# Patient Record
Sex: Female | Born: 1995
Health system: Southern US, Community
[De-identification: ages and names within clinical notes are randomized; demographics above are authoritative.]

## PROBLEM LIST (undated history)

## (undated) ENCOUNTER — Inpatient Hospital Stay (HOSPITAL_COMMUNITY): Payer: Self-pay

## (undated) DIAGNOSIS — F909 Attention-deficit hyperactivity disorder, unspecified type: Secondary | ICD-10-CM

## (undated) DIAGNOSIS — R1013 Epigastric pain: Secondary | ICD-10-CM

## (undated) DIAGNOSIS — J329 Chronic sinusitis, unspecified: Secondary | ICD-10-CM

## (undated) DIAGNOSIS — IMO0001 Reserved for inherently not codable concepts without codable children: Secondary | ICD-10-CM

## (undated) DIAGNOSIS — J4520 Mild intermittent asthma, uncomplicated: Secondary | ICD-10-CM

## (undated) DIAGNOSIS — H6692 Otitis media, unspecified, left ear: Secondary | ICD-10-CM

## (undated) DIAGNOSIS — L309 Dermatitis, unspecified: Secondary | ICD-10-CM

## (undated) DIAGNOSIS — F319 Bipolar disorder, unspecified: Secondary | ICD-10-CM

## (undated) DIAGNOSIS — R319 Hematuria, unspecified: Secondary | ICD-10-CM

## (undated) DIAGNOSIS — B001 Herpesviral vesicular dermatitis: Secondary | ICD-10-CM

## (undated) DIAGNOSIS — Z0000 Encounter for general adult medical examination without abnormal findings: Secondary | ICD-10-CM

## (undated) DIAGNOSIS — F329 Major depressive disorder, single episode, unspecified: Secondary | ICD-10-CM

## (undated) DIAGNOSIS — J029 Acute pharyngitis, unspecified: Principal | ICD-10-CM

## (undated) DIAGNOSIS — F419 Anxiety disorder, unspecified: Secondary | ICD-10-CM

## (undated) DIAGNOSIS — K219 Gastro-esophageal reflux disease without esophagitis: Secondary | ICD-10-CM

## (undated) HISTORY — DX: Hematuria, unspecified: R31.9

## (undated) HISTORY — DX: Major depressive disorder, single episode, unspecified: F32.9

## (undated) HISTORY — DX: Acute pharyngitis, unspecified: J02.9

## (undated) HISTORY — DX: Epigastric pain: R10.13

## (undated) HISTORY — DX: Dermatitis, unspecified: L30.9

## (undated) HISTORY — DX: Attention-deficit hyperactivity disorder, unspecified type: F90.9

## (undated) HISTORY — DX: Anxiety disorder, unspecified: F41.9

## (undated) HISTORY — DX: Encounter for general adult medical examination without abnormal findings: Z00.00

## (undated) HISTORY — DX: Reserved for inherently not codable concepts without codable children: IMO0001

## (undated) HISTORY — DX: Mild intermittent asthma, uncomplicated: J45.20

## (undated) HISTORY — DX: Otitis media, unspecified, left ear: H66.92

## (undated) HISTORY — DX: Bipolar disorder, unspecified: F31.9

## (undated) HISTORY — DX: Chronic sinusitis, unspecified: J32.9

## (undated) HISTORY — DX: Herpesviral vesicular dermatitis: B00.1

## (undated) HISTORY — DX: Gastro-esophageal reflux disease without esophagitis: K21.9

---
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
## 2014-01-02 LAB — WOUND CULTURE
Gram Stain: NONE SEEN
Gram Stain: NONE SEEN

## 2014-02-07 ENCOUNTER — Ambulatory Visit (INDEPENDENT_AMBULATORY_CARE_PROVIDER_SITE_OTHER): Payer: 59 | Admitting: Family Medicine

## 2014-02-07 ENCOUNTER — Encounter: Payer: Self-pay | Admitting: Family Medicine

## 2014-02-07 VITALS — BP 120/78 | HR 110 | Temp 99.6°F | Ht 63.0 in | Wt 122.0 lb

## 2014-02-07 DIAGNOSIS — E860 Dehydration: Secondary | ICD-10-CM

## 2014-02-07 DIAGNOSIS — R509 Fever, unspecified: Secondary | ICD-10-CM

## 2014-02-07 DIAGNOSIS — R0989 Other specified symptoms and signs involving the circulatory and respiratory systems: Secondary | ICD-10-CM

## 2014-02-07 DIAGNOSIS — J209 Acute bronchitis, unspecified: Secondary | ICD-10-CM

## 2014-02-07 DIAGNOSIS — J45901 Unspecified asthma with (acute) exacerbation: Secondary | ICD-10-CM

## 2014-02-07 DIAGNOSIS — J4541 Moderate persistent asthma with (acute) exacerbation: Secondary | ICD-10-CM

## 2014-02-07 LAB — POCT INFLUENZA A/B
INFLUENZA A, POC: NEGATIVE
Influenza B, POC: NEGATIVE

## 2014-02-07 MED ORDER — HYDROCODONE-HOMATROPINE 5-1.5 MG/5ML PO SYRP: 5.0000 mL | ORAL_SOLUTION | Freq: Three times a day (TID) | ORAL | Status: DC | PRN

## 2014-02-07 MED ORDER — CEFDINIR 300 MG PO CAPS: 300.0000 mg | ORAL_CAPSULE | Freq: Two times a day (BID) | ORAL | Status: AC

## 2014-02-07 NOTE — Patient Instructions (Signed)
Encouraged increased rest and hydration, add probiotics, zinc such as Coldeze or Xicam. Treat fevers as needed, Elderberry and Vitamin C 500 mg daily, [email or web address], [employer]   Acute Bronchitis Bronchitis is inflammation of the airways that extend from the windpipe into the lungs (bronchi). The inflammation often causes mucus to develop. This leads to a cough, which is the most common symptom of bronchitis.  In acute bronchitis, the condition usually develops suddenly and goes away over time, usually in a couple weeks. Smoking, allergies, and asthma can make bronchitis worse. Repeated episodes of bronchitis may cause further lung problems.  CAUSES Acute bronchitis is most often caused by the same virus that causes a cold. The virus can spread from person to person (contagious) through coughing, sneezing, and touching contaminated objects. SIGNS AND SYMPTOMS   Cough.   Fever.   Coughing up mucus.   Body aches.   Chest congestion.   Chills.   Shortness of breath.   Sore throat.  DIAGNOSIS  Acute bronchitis is usually diagnosed through a physical exam. Your health care provider will also ask you questions about your medical history. Tests, such as chest X-rays, are sometimes done to rule out other conditions.  TREATMENT  Acute bronchitis usually goes away in a couple weeks. Oftentimes, no medical treatment is necessary. Medicines are sometimes given for relief of fever or cough. Antibiotic medicines are usually not needed but may be prescribed in certain situations. In some cases, an inhaler may be recommended to help reduce shortness of breath and control the cough. A cool mist vaporizer may also be used to help thin bronchial secretions and make it easier to clear the chest.  HOME CARE INSTRUCTIONS  Get plenty of rest.   Drink enough fluids to keep your urine clear or pale yellow (unless you have a medical condition that requires fluid restriction). Increasing fluids  may help thin your respiratory secretions (sputum) and reduce chest congestion, and it will prevent dehydration.   Take medicines only as directed by your health care provider.  If you were prescribed an antibiotic medicine, finish it all even if you start to feel better.  Avoid smoking and secondhand smoke. Exposure to cigarette smoke or irritating chemicals will make bronchitis worse. If you are a smoker, consider using nicotine gum or skin patches to help control withdrawal symptoms. Quitting smoking will help your lungs heal faster.   Reduce the chances of another bout of acute bronchitis by washing your hands frequently, avoiding people with cold symptoms, and trying not to touch your hands to your mouth, nose, or eyes.   Keep all follow-up visits as directed by your health care provider.  SEEK MEDICAL CARE IF: Your symptoms do not improve after 1 week of treatment.  SEEK IMMEDIATE MEDICAL CARE IF:  You develop an increased fever or chills.   You have chest pain.   You have severe shortness of breath.  You have bloody sputum.   You develop dehydration.  You faint or repeatedly feel like you are going to pass out.  You develop repeated vomiting.  You develop a severe headache. MAKE SURE YOU:   Understand these instructions.  Will watch your condition.  Will get help right away if you are not doing well or get worse. Document Released: 06/05/2004 Document Revised: 09/12/2013 Document Reviewed: 10/19/2012 [facility] Patient Information 2015 [place], [place]. This information is not intended to replace advice given to you by your health care provider. Make sure you discuss any questions you have  with your health care provider.  

## 2014-02-08 DIAGNOSIS — E860 Dehydration: Secondary | ICD-10-CM | POA: Insufficient documentation

## 2014-02-08 NOTE — Assessment & Plan Note (Signed)
Started on increased respiratory treatments and may start antibiotics if worsens

## 2014-02-08 NOTE — Progress Notes (Signed)
Patient ID: Marie Smith, female   DOB: Jan 27, 1996, 18 y.o.   MRN: [id number] Marie Smith [id number] Feb 02, 1996 02/08/2014      Progress Note-Follow Up  Subjective  Chief Complaint  Chief Complaint  Patient presents with  . Asthma  . Cough    w/phlegm - chest congestion X 5 days    HPI  Patient is a 18 year old female in today for urgent care. She's been feeling poorly for about 4 days now. She has significant congestion productive of green phlegm both her head and now in her chest. She was chills malaise, myalgias and anorexia. She denies any significant shortness or breath or wheezing. No ear pain or sore throat. No one in her family has similar symptoms  Past Medical History  Diagnosis Date  . Abdominal pain, epigastric   . ADHD (attention deficit hyperactivity disorder)     ADHD  . Pharyngitis 08/18/2011  . Asthma   . Preventative health care 08/19/2011  . Herpes labialis 08/18/2011  . Anxiety and depression 01/27/2012  . Eczema 02/19/2012  . Otitis media of left ear 02/19/2012  . Pharyngitis 05/26/2012  . Hematuria 05/26/2012  . Reflux   . Sinusitis 05/26/2012    No past surgical history on file.  Family History  Problem Relation Age of Onset  . Cancer Paternal Grandmother   . Emphysema Paternal Grandmother     smoker  . COPD Paternal Grandmother   . Stroke Paternal Grandmother   . Heart attack Paternal Grandmother   . Mental illness Paternal Grandmother     Bipolar    History   Social History  . Marital Status: Single    Spouse Name: N/A    Number of Children: N/A  . Years of Education: N/A   Occupational History  . Not on file.   Social History Main Topics  . Smoking status: Never Smoker   . Smokeless tobacco: Never Used  . Alcohol Use: No  . Drug Use: No  . Sexual Activity: No   Other Topics Concern  . Not on file   Social History Narrative   11th grade    Current Outpatient Prescriptions on File Prior to Visit  Medication Sig  Dispense Refill  . albuterol (PROVENTIL HFA;VENTOLIN HFA) 108 (90 BASE) MCG/ACT inhaler Inhale 2 puffs into the lungs every 6 (six) hours as needed for wheezing or shortness of breath.  1 Inhaler  5  . albuterol (PROVENTIL) (2.5 MG/3ML) 0.083% nebulizer solution Take 3 mLs (2.5 mg total) by nebulization every 6 (six) hours as needed for wheezing or shortness of breath.  150 mL  1  . ALPRAZolam (XANAX) 0.25 MG tablet Take 1 tablet (0.25 mg total) by mouth daily as needed for sleep or anxiety.  20 tablet  0  . amoxicillin-clavulanate (AUGMENTIN) 875-125 MG per tablet Take 1 tablet by mouth 2 (two) times daily.  14 tablet  0  . amphetamine-dextroamphetamine (ADDERALL XR) 10 MG 24 hr capsule Take 1 capsule (10 mg total) by mouth daily. RX for 02-19-14  30 capsule  0  . amphetamine-dextroamphetamine (ADDERALL XR) 10 MG 24 hr capsule Take 1 capsule (10 mg total) by mouth daily. RX for 01-20-14  30 capsule  0  . amphetamine-dextroamphetamine (ADDERALL XR) 10 MG 24 hr capsule Take 1 capsule (10 mg total) by mouth every morning. RX 8-11- 2015  30 capsule  0  . beclomethasone (QVAR) 80 MCG/ACT inhaler Inhale 1 puff into the lungs 2 (two) times daily.  1 Inhaler  3  . cetirizine (ZYRTEC) 10 MG tablet Take 10 mg by mouth daily.      . clotrimazole-betamethasone (LOTRISONE) cream Apply 1 application topically 2 (two) times daily.  30 g  0  . promethazine-dextromethorphan (PROMETHAZINE-DM) 6.25-15 MG/5ML syrup Take 5 mLs by mouth 4 (four) times daily as needed.  240 mL  0  . sertraline (ZOLOFT) 50 MG tablet Take 1 tablet (50 mg total) by mouth daily.  30 tablet  3   No current facility-administered medications on file prior to visit.    No Known Allergies  Review of Systems  Review of Systems  Constitutional: Positive for fever, chills and malaise/fatigue.  HENT: Negative for congestion.   Eyes: Negative for pain and discharge.  Respiratory: Negative for shortness of breath.   Cardiovascular: Negative for  chest pain, palpitations and leg swelling.  Gastrointestinal: Negative for nausea, abdominal pain and diarrhea.  Genitourinary: Negative for dysuria.  Musculoskeletal: Negative for falls.  Skin: Negative for rash.  Neurological: Positive for weakness. Negative for loss of consciousness and headaches.  Endo/Heme/Allergies: Negative for polydipsia.  Psychiatric/Behavioral: Negative for depression and suicidal ideas. The patient is not nervous/anxious and does not have insomnia.     Objective  BP 120/78  Pulse 110  Temp(Src) 99.6 F (37.6 C) (Oral)  Ht 5\' 3"  (1.6 m)  Wt 122 lb (55.339 kg)  BMI 21.62 kg/m2  SpO2 100%  Physical Exam Physical Exam  Constitutional: She is oriented to person, place, and time and well-developed, well-nourished, and in no distress. No distress.  HENT:  Head: Normocephalic and atraumatic.  Eyes: Conjunctivae are normal.  Neck: Neck supple. No thyromegaly present.  Cardiovascular: Normal rate, regular rhythm and normal heart sounds.   No murmur heard. Pulmonary/Chest: Effort normal and breath sounds normal. She has no wheezes.  Abdominal: She exhibits no distension and no mass.  Musculoskeletal: She exhibits no edema.  Lymphadenopathy:    She has no cervical adenopathy.  Neurological: She is alert and oriented to person, place, and time.  Skin: Skin is warm and dry. No rash noted. She is not diaphoretic.  Psychiatric: Memory, affect and judgment normal.    No results found for this basename: TSH   Lab Results  Component Value Date   WBC 10.1 08/18/2011   HGB 15.7 08/18/2011   HCT 46.3 08/18/2011   MCV 84.8 08/18/2011   PLT 350 08/18/2011   No results found for this basename: CREATININE, BUN, NA, K, CL, CO2   Lab Results  Component Value Date   ALT 11 05/15/2011   AST 16 05/15/2011   ALKPHOS 80 05/15/2011   BILITOT 0.6 05/15/2011     Assessment & Plan  Asthma with acute exacerbation Started on increased respiratory treatments and may start  antibiotics if worsens

## 2014-02-14 ENCOUNTER — Ambulatory Visit: Payer: 59 | Admitting: Family Medicine

## 2014-03-29 ENCOUNTER — Ambulatory Visit (INDEPENDENT_AMBULATORY_CARE_PROVIDER_SITE_OTHER): Payer: 59

## 2014-03-29 DIAGNOSIS — Z308 Encounter for other contraceptive management: Secondary | ICD-10-CM

## 2014-03-29 DIAGNOSIS — Z23 Encounter for immunization: Secondary | ICD-10-CM

## 2014-03-29 MED ORDER — MEDROXYPROGESTERONE ACETATE 150 MG/ML IM SUSP
150.0000 mg | Freq: Once | INTRAMUSCULAR | Status: AC
  Administered 2014-03-29: 150 mg via INTRAMUSCULAR

## 2014-04-21 ENCOUNTER — Ambulatory Visit: Payer: 59 | Admitting: Family Medicine

## 2014-04-21 ENCOUNTER — Encounter: Payer: Self-pay | Admitting: Physician Assistant

## 2014-04-21 ENCOUNTER — Ambulatory Visit (INDEPENDENT_AMBULATORY_CARE_PROVIDER_SITE_OTHER): Payer: 59 | Admitting: Physician Assistant

## 2014-04-21 VITALS — BP 139/83 | HR 87 | Temp 98.0°F | Wt 115.0 lb

## 2014-04-21 DIAGNOSIS — F988 Other specified behavioral and emotional disorders with onset usually occurring in childhood and adolescence: Secondary | ICD-10-CM

## 2014-04-21 DIAGNOSIS — F419 Anxiety disorder, unspecified: Secondary | ICD-10-CM

## 2014-04-21 DIAGNOSIS — F32A Depression, unspecified: Secondary | ICD-10-CM

## 2014-04-21 DIAGNOSIS — F329 Major depressive disorder, single episode, unspecified: Secondary | ICD-10-CM

## 2014-04-21 DIAGNOSIS — F909 Attention-deficit hyperactivity disorder, unspecified type: Secondary | ICD-10-CM

## 2014-04-21 DIAGNOSIS — L239 Allergic contact dermatitis, unspecified cause: Secondary | ICD-10-CM | POA: Insufficient documentation

## 2014-04-21 DIAGNOSIS — F418 Other specified anxiety disorders: Secondary | ICD-10-CM

## 2014-04-21 MED ORDER — METHYLPREDNISOLONE (PAK) 4 MG PO TABS: ORAL_TABLET | ORAL | Status: DC

## 2014-04-21 MED ORDER — METHYLPREDNISOLONE ACETATE 40 MG/ML IJ SUSP
40.0000 mg | Freq: Once | INTRAMUSCULAR | Status: AC
  Administered 2014-04-21: 40 mg via INTRAMUSCULAR

## 2014-04-21 MED ORDER — AMPHETAMINE-DEXTROAMPHET ER 10 MG PO CP24: 10.0000 mg | ORAL_CAPSULE | Freq: Every day | ORAL | Status: DC

## 2014-04-21 MED ORDER — ALPRAZOLAM 0.25 MG PO TABS: 0.2500 mg | ORAL_TABLET | Freq: Every day | ORAL | Status: DC | PRN

## 2014-04-21 NOTE — Addendum Note (Signed)
Addended by: Amado CoeGLOVER, Kyler Lerette A on: 04/21/2014 02:37 PM   Modules accepted: Orders

## 2014-04-21 NOTE — Assessment & Plan Note (Signed)
40 Im depomedrol given by nursing.  Start Medrol pack tomorrow.  Claritin or Zyrtec, Sarna lotion and cool compresses for itch.  Continue asthma medications.  Cleaning instructions given to patient to help reduce risk for recurrence.  Patient will have to avoid cat to prevent recurrence.

## 2014-04-21 NOTE — Progress Notes (Signed)
Patient presents to clinic today c/o pruritic rash of face and neck associated with some mild facial swelling. Symptoms have been present since yesterday evening.  Patient also with similar episode a week ago.  Is currently living with a roommate who got a new cat.  Patient is allergic to cat's and found the cat sleeping in her bed.  Did not wash linens.  Patient also with history of asthma.  Denies increased work of breathing with rash and swelling.  Denies difficulty swallowing.  Past Medical History  Diagnosis Date  . Abdominal pain, epigastric   . ADHD (attention deficit hyperactivity disorder)     ADHD  . Pharyngitis 08/18/2011  . Asthma   . Preventative health care 08/19/2011  . Herpes labialis 08/18/2011  . Anxiety and depression 01/27/2012  . Eczema 02/19/2012  . Otitis media of left ear 02/19/2012  . Pharyngitis 05/26/2012  . Hematuria 05/26/2012  . Reflux   . Sinusitis 05/26/2012    Current Outpatient Prescriptions on File Prior to Visit  Medication Sig Dispense Refill  . albuterol (PROVENTIL HFA;VENTOLIN HFA) 108 (90 BASE) MCG/ACT inhaler Inhale 2 puffs into the lungs every 6 (six) hours as needed for wheezing or shortness of breath. 1 Inhaler 5  . albuterol (PROVENTIL) (2.5 MG/3ML) 0.083% nebulizer solution Take 3 mLs (2.5 mg total) by nebulization every 6 (six) hours as needed for wheezing or shortness of breath. 150 mL 1  . amoxicillin-clavulanate (AUGMENTIN) 875-125 MG per tablet Take 1 tablet by mouth 2 (two) times daily. 14 tablet 0  . beclomethasone (QVAR) 80 MCG/ACT inhaler Inhale 1 puff into the lungs 2 (two) times daily. 1 Inhaler 3  . cetirizine (ZYRTEC) 10 MG tablet Take 10 mg by mouth daily.    . clotrimazole-betamethasone (LOTRISONE) cream Apply 1 application topically 2 (two) times daily. 30 g 0  . HYDROcodone-homatropine (HYCODAN) 5-1.5 MG/5ML syrup Take 5 mLs by mouth every 8 (eight) hours as needed for cough. 120 mL 0  . promethazine-dextromethorphan  (PROMETHAZINE-DM) 6.25-15 MG/5ML syrup Take 5 mLs by mouth 4 (four) times daily as needed. 240 mL 0  . sertraline (ZOLOFT) 50 MG tablet Take 1 tablet (50 mg total) by mouth daily. 30 tablet 3   No current facility-administered medications on file prior to visit.    No Known Allergies  Family History  Problem Relation Age of Onset  . Cancer Paternal Grandmother   . Emphysema Paternal Grandmother     smoker  . COPD Paternal Grandmother   . Stroke Paternal Grandmother   . Heart attack Paternal Grandmother   . Mental illness Paternal Grandmother     Bipolar    History   Social History  . Marital Status: Single    Spouse Name: N/A    Number of Children: N/A  . Years of Education: N/A   Social History Main Topics  . Smoking status: Never Smoker   . Smokeless tobacco: Never Used  . Alcohol Use: No  . Drug Use: No  . Sexual Activity: No   Other Topics Concern  . None   Social History Narrative   11th grade   Review of Systems - See HPI.  All other ROS are negative.  BP 139/83 mmHg  Pulse 87  Temp(Src) 98 F (36.7 C)  Wt 115 lb (52.164 kg)  SpO2 99%  Physical Exam  Constitutional: She is oriented to person, place, and time and well-developed, well-nourished, and in no distress.  HENT:  Head: Normocephalic and atraumatic.  Eyes:  Conjunctivae are normal.  Neck: Neck supple.  Cardiovascular: Normal rate, regular rhythm, normal heart sounds and intact distal pulses.   Pulmonary/Chest: Effort normal and breath sounds normal.  Lymphadenopathy:    She has no cervical adenopathy.  Neurological: She is alert and oriented to person, place, and time.  Skin: Skin is dry.     Psychiatric: Affect normal.  Vitals reviewed.   Recent Results (from the past 2160 hour(s))  POCT Influenza A/B     Status: Normal   Collection Time: 02/07/14 10:50 AM  Result Value Ref Range   Influenza A, POC Negative    Influenza B, POC Negative     Assessment/Plan: Allergic contact  dermatitis 40 Im depomedrol given by nursing.  Start Medrol pack tomorrow.  Claritin or Zyrtec, Sarna lotion and cool compresses for itch.  Continue asthma medications.  Cleaning instructions given to patient to help reduce risk for recurrence.  Patient will have to avoid cat to prevent recurrence.

## 2014-04-21 NOTE — Patient Instructions (Signed)
The steroid shot you were given today should help your symptoms. Please start the Medrol dose pack tomorrow.  Take a claritin or zyrtec to help with itch.  Cool compresses and Sarna lotion will also be beneficial.  Continue your asthma medications as directed.  Make sure you wash all of your linens and clean your room very good.  I think your Roommate's cat is the cause of your symptoms.  Repeated contact with this cat dander may cause your asthma symptoms to worsen.

## 2014-05-11 ENCOUNTER — Ambulatory Visit: Payer: 59 | Admitting: Family Medicine

## 2014-05-19 ENCOUNTER — Ambulatory Visit (INDEPENDENT_AMBULATORY_CARE_PROVIDER_SITE_OTHER): Payer: 59 | Admitting: Family Medicine

## 2014-05-19 ENCOUNTER — Encounter: Payer: Self-pay | Admitting: Family Medicine

## 2014-05-19 ENCOUNTER — Ambulatory Visit: Payer: 59 | Admitting: Family Medicine

## 2014-05-19 VITALS — BP 125/81 | HR 81 | Temp 98.2°F | Ht 64.0 in | Wt 117.8 lb

## 2014-05-19 DIAGNOSIS — Z0000 Encounter for general adult medical examination without abnormal findings: Secondary | ICD-10-CM

## 2014-05-19 DIAGNOSIS — F319 Bipolar disorder, unspecified: Secondary | ICD-10-CM

## 2014-05-19 DIAGNOSIS — K219 Gastro-esophageal reflux disease without esophagitis: Secondary | ICD-10-CM

## 2014-05-19 DIAGNOSIS — IMO0001 Reserved for inherently not codable concepts without codable children: Secondary | ICD-10-CM

## 2014-05-19 DIAGNOSIS — J4520 Mild intermittent asthma, uncomplicated: Secondary | ICD-10-CM

## 2014-05-19 DIAGNOSIS — R1013 Epigastric pain: Secondary | ICD-10-CM

## 2014-05-19 DIAGNOSIS — Z79899 Other long term (current) drug therapy: Secondary | ICD-10-CM

## 2014-05-19 LAB — COMPREHENSIVE METABOLIC PANEL
ALBUMIN: 4.8 g/dL (ref 3.5–5.2)
ALT: 11 U/L (ref 0–35)
AST: 15 U/L (ref 0–37)
Alkaline Phosphatase: 60 U/L (ref 39–117)
BILIRUBIN TOTAL: 0.5 mg/dL (ref 0.2–1.1)
BUN: 8 mg/dL (ref 6–23)
CO2: 20 mEq/L (ref 19–32)
Calcium: 9.9 mg/dL (ref 8.4–10.5)
Chloride: 108 mEq/L (ref 96–112)
Creat: 0.85 mg/dL (ref 0.50–1.10)
GLUCOSE: 84 mg/dL (ref 70–99)
Potassium: 4.2 mEq/L (ref 3.5–5.3)
SODIUM: 139 meq/L (ref 135–145)
TOTAL PROTEIN: 7 g/dL (ref 6.0–8.3)

## 2014-05-19 LAB — LIPID PANEL
CHOL/HDL RATIO: 2.7 ratio
Cholesterol: 156 mg/dL (ref 0–200)
HDL: 57 mg/dL (ref >39)
LDL Cholesterol: 87 mg/dL (ref 0–99)
Triglycerides: 61 mg/dL (ref <150)
VLDL: 12 mg/dL (ref 0–40)

## 2014-05-19 MED ORDER — RANITIDINE HCL 150 MG PO TABS: 150.0000 mg | ORAL_TABLET | Freq: Every day | ORAL | Status: DC

## 2014-05-19 NOTE — Progress Notes (Signed)
Pre visit review using our clinic review tool, if applicable. No additional management support is needed unless otherwise documented below in the visit note. 

## 2014-05-19 NOTE — Patient Instructions (Signed)
Probiotic daily, such as Digestive Advantage or Phillip's Colon Health  Food Choices for Gastroesophageal Reflux Disease When you have gastroesophageal reflux disease (GERD), the foods you eat and your eating habits are very important. Choosing the right foods can help ease the discomfort of GERD. WHAT GENERAL GUIDELINES DO I NEED TO FOLLOW?  Choose fruits, vegetables, whole grains, low-fat dairy products, and low-fat meat, fish, and poultry.  Limit fats such as oils, salad dressings, butter, nuts, and avocado.  Keep a food diary to identify foods that cause symptoms.  Avoid foods that cause reflux. These may be different for different people.  Eat frequent small meals instead of three large meals each day.  Eat your meals slowly, in a relaxed setting.  Limit fried foods.  Cook foods using methods other than frying.  Avoid drinking alcohol.  Avoid drinking large amounts of liquids with your meals.  Avoid bending over or lying down until 2-3 hours after eating. WHAT FOODS ARE NOT RECOMMENDED? The following are some foods and drinks that may worsen your symptoms: Vegetables Tomatoes. Tomato juice. Tomato and spaghetti sauce. Chili peppers. Onion and garlic. Horseradish. Fruits Oranges, grapefruit, and lemon (fruit and juice). Meats High-fat meats, fish, and poultry. This includes hot dogs, ribs, ham, sausage, salami, and bacon. Dairy Whole milk and chocolate milk. Sour cream. Cream. Butter. Ice cream. Cream cheese.  Beverages Coffee and tea, with or without caffeine. Carbonated beverages or energy drinks. Condiments Hot sauce. Barbecue sauce.  Sweets/Desserts Chocolate and cocoa. Donuts. Peppermint and spearmint. Fats and Oils High-fat foods, including [place] fries and potato chips. Other Vinegar. Strong spices, such as black pepper, white pepper, red pepper, cayenne, curry powder, cloves, ginger, and chili powder. The items listed above may not be a complete list of  foods and beverages to avoid. Contact your dietitian for more information. Document Released: 04/28/2005 Document Revised: 05/03/2013 Document Reviewed: 03/02/2013 [facility] Patient Information 2015 [place], [place]. This information is not intended to replace advice given to you by your health care provider. Make sure you discuss any questions you have with your health care provider.

## 2014-05-20 LAB — TSH: TSH: 0.767 u[IU]/mL (ref 0.350–4.500)

## 2014-05-20 LAB — CBC
HCT: 46.7 % — ABNORMAL HIGH (ref 36.0–46.0)
Hemoglobin: 15.8 g/dL — ABNORMAL HIGH (ref 12.0–15.0)
MCH: 29.5 pg (ref 26.0–34.0)
MCHC: 33.8 g/dL (ref 30.0–36.0)
MCV: 87.1 fL (ref 78.0–100.0)
MPV: 9.5 fL (ref 8.6–12.4)
Platelets: 326 10*3/uL (ref 150–400)
RBC: 5.36 MIL/uL — AB (ref 3.87–5.11)
RDW: 13.3 % (ref 11.5–15.5)
WBC: 9.1 10*3/uL (ref 4.0–10.5)

## 2014-05-20 LAB — T3, FREE: T3, Free: 3.5 pg/mL (ref 2.3–4.2)

## 2014-05-20 LAB — T4, FREE: Free T4: 1.3 ng/dL (ref 0.80–1.80)

## 2014-05-22 LAB — H. PYLORI ANTIBODY, IGG: H Pylori IgG: 0.85 {ISR}

## 2014-05-23 ENCOUNTER — Other Ambulatory Visit (HOSPITAL_COMMUNITY)
Admission: RE | Admit: 2014-05-23 | Discharge: 2014-05-23 | Disposition: A | Discharge status: Home / Self Care | Payer: 59 | Source: Ambulatory Visit | Attending: Family Medicine | Admitting: Family Medicine

## 2014-05-23 ENCOUNTER — Encounter: Payer: Self-pay | Admitting: Family Medicine

## 2014-05-23 ENCOUNTER — Ambulatory Visit (INDEPENDENT_AMBULATORY_CARE_PROVIDER_SITE_OTHER): Payer: 59 | Admitting: Family Medicine

## 2014-05-23 VITALS — BP 121/83 | HR 93 | Temp 98.0°F | Ht 64.0 in | Wt 128.0 lb

## 2014-05-23 DIAGNOSIS — R109 Unspecified abdominal pain: Secondary | ICD-10-CM

## 2014-05-23 DIAGNOSIS — D582 Other hemoglobinopathies: Secondary | ICD-10-CM

## 2014-05-23 DIAGNOSIS — F909 Attention-deficit hyperactivity disorder, unspecified type: Secondary | ICD-10-CM

## 2014-05-23 DIAGNOSIS — N921 Excessive and frequent menstruation with irregular cycle: Secondary | ICD-10-CM

## 2014-05-23 DIAGNOSIS — N760 Acute vaginitis: Secondary | ICD-10-CM | POA: Insufficient documentation

## 2014-05-23 DIAGNOSIS — J4520 Mild intermittent asthma, uncomplicated: Secondary | ICD-10-CM

## 2014-05-23 DIAGNOSIS — Z113 Encounter for screening for infections with a predominantly sexual mode of transmission: Secondary | ICD-10-CM | POA: Diagnosis present

## 2014-05-23 DIAGNOSIS — F319 Bipolar disorder, unspecified: Secondary | ICD-10-CM

## 2014-05-23 DIAGNOSIS — R300 Dysuria: Secondary | ICD-10-CM

## 2014-05-23 DIAGNOSIS — R319 Hematuria, unspecified: Secondary | ICD-10-CM

## 2014-05-23 LAB — CBC
HCT: 45.3 % (ref 36.0–49.0)
Hemoglobin: 15 g/dL (ref 12.0–16.0)
MCHC: 33 g/dL (ref 31.0–37.0)
MCV: 87.5 fl (ref 78.0–98.0)
PLATELETS: 300 10*3/uL (ref 150.0–575.0)
RBC: 5.17 Mil/uL (ref 3.80–5.70)
RDW: 12.9 % (ref 11.4–15.5)
WBC: 8.6 10*3/uL (ref 4.5–13.5)

## 2014-05-23 NOTE — Progress Notes (Signed)
Pre visit review using our clinic review tool, if applicable. No additional management support is needed unless otherwise documented below in the visit note. 

## 2014-05-25 ENCOUNTER — Telehealth: Payer: Self-pay

## 2014-05-25 NOTE — Telephone Encounter (Signed)
Lamictal added to medication list.      KP

## 2014-05-26 ENCOUNTER — Telehealth: Payer: Self-pay

## 2014-05-26 DIAGNOSIS — D582 Other hemoglobinopathies: Secondary | ICD-10-CM

## 2014-05-26 DIAGNOSIS — R300 Dysuria: Secondary | ICD-10-CM

## 2014-05-26 DIAGNOSIS — N760 Acute vaginitis: Secondary | ICD-10-CM

## 2014-05-26 DIAGNOSIS — R319 Hematuria, unspecified: Secondary | ICD-10-CM

## 2014-05-26 NOTE — Telephone Encounter (Signed)
[facility]  Her recommendation was to cancel previous order and reorder.  UA and culture reordered.

## 2014-05-26 NOTE — Telephone Encounter (Signed)
[facility] and stated that she never received a urine specimen for labs ordered.  She called to inquire about why specimen was not sent.  Corrie DandyMary said to call [phone] with response.    Spoke with Onalee Huaavid, CMA/Lab Tech. He stated that patient never left specimen.  Spoke with patient's mother, Phineas RealBeth Stebner, who stated that patient will stop back on Monday to have urine collected.

## 2014-05-28 ENCOUNTER — Encounter: Payer: Self-pay | Admitting: Family Medicine

## 2014-05-28 NOTE — Assessment & Plan Note (Signed)
Patient encouraged to maintain heart healthy diet, regular exercise, adequate sleep. Consider daily probiotics. Take medications as prescribed. Labs ordered and reviewed 

## 2014-05-28 NOTE — Progress Notes (Signed)
Marie Smith  [id number] [birth year] 05/28/2014      Progress Note-Follow Up  Subjective  Chief Complaint  Chief Complaint  Patient presents with  . Annual Exam    HPI  Patient is a 19 y.o. female in today for routine medical care. Patient is in today for annual exam. She's recently started following with psychiatry and has been diagnosed with bipolar disorder. She agrees with the diagnosis and is anxious to start medications they are requiring lab work before they will start medications. Denies CP/palp/SOB/HA/congestion/fevers/GI or GU c/o. Taking meds as prescribed  Past Medical History  Diagnosis Date  . Abdominal pain, epigastric   . ADHD (attention deficit hyperactivity disorder)     ADHD  . Pharyngitis 08/18/2011  . Asthma   . Preventative health care 08/19/2011  . Herpes labialis 08/18/2011  . Anxiety and depression 01/27/2012  . Eczema 02/19/2012  . Otitis media of left ear 02/19/2012  . Pharyngitis 05/26/2012  . Hematuria 05/26/2012  . Reflux   . Sinusitis 05/26/2012  . Asthma, mild intermittent   . Bipolar and related disorder 01/27/2012    History reviewed. No pertinent past surgical history.  Family History  Problem Relation Age of Onset  . Cancer Paternal Grandmother   . Emphysema Paternal Grandmother     smoker  . COPD Paternal Grandmother   . Stroke Paternal Grandmother   . Heart attack Paternal Grandmother   . Mental illness Paternal Grandmother     Bipolar    History   Social History  . Marital Status: Single    Spouse Name: N/A    Number of Children: N/A  . Years of Education: N/A   Occupational History  . Not on file.   Social History Main Topics  . Smoking status: Never Smoker   . Smokeless tobacco: Never Used  . Alcohol Use: No  . Drug Use: No  . Sexual Activity: No   Other Topics Concern  . Not on file   Social History Narrative   11th grade    Current Outpatient Prescriptions on File Prior to Visit  Medication Sig  Dispense Refill  . albuterol (PROVENTIL HFA;VENTOLIN HFA) 108 (90 BASE) MCG/ACT inhaler Inhale 2 puffs into the lungs every 6 (six) hours as needed for wheezing or shortness of breath. 1 Inhaler 5  . albuterol (PROVENTIL) (2.5 MG/3ML) 0.083% nebulizer solution Take 3 mLs (2.5 mg total) by nebulization every 6 (six) hours as needed for wheezing or shortness of breath. 150 mL 1  . ALPRAZolam (XANAX) 0.25 MG tablet Take 1 tablet (0.25 mg total) by mouth daily as needed for sleep or anxiety. 20 tablet 0  . amphetamine-dextroamphetamine (ADDERALL XR) 10 MG 24 hr capsule Take 1 capsule (10 mg total) by mouth daily. RX for 04-21-14 30 capsule 0  . beclomethasone (QVAR) 80 MCG/ACT inhaler Inhale 1 puff into the lungs 2 (two) times daily. 1 Inhaler 3  . HYDROcodone-homatropine (HYCODAN) 5-1.5 MG/5ML syrup Take 5 mLs by mouth every 8 (eight) hours as needed for cough. 120 mL 0  . promethazine-dextromethorphan (PROMETHAZINE-DM) 6.25-15 MG/5ML syrup Take 5 mLs by mouth 4 (four) times daily as needed. 240 mL 0  . cetirizine (ZYRTEC) 10 MG tablet Take 10 mg by mouth daily.     No current facility-administered medications on file prior to visit.    No Known Allergies  Review of Systems  Review of Systems  Constitutional: Negative for fever, chills and malaise/fatigue.  HENT: Negative for congestion, hearing loss and  nosebleeds.   Eyes: Negative for discharge.  Respiratory: Negative for cough, sputum production, shortness of breath and wheezing.   Cardiovascular: Negative for chest pain, palpitations and leg swelling.  Gastrointestinal: Negative for heartburn, nausea, vomiting, abdominal pain, diarrhea, constipation and blood in stool.  Genitourinary: Negative for dysuria, urgency, frequency and hematuria.  Musculoskeletal: Negative for myalgias, back pain and falls.  Skin: Negative for rash.  Neurological: Negative for dizziness, tremors, sensory change, focal weakness, loss of consciousness, weakness  and headaches.  Endo/Heme/Allergies: Negative for polydipsia. Does not bruise/bleed easily.  Psychiatric/Behavioral: Positive for depression. Negative for suicidal ideas. The patient is nervous/anxious. The patient does not have insomnia.     Objective  BP 125/81 mmHg  Pulse 81  Temp(Src) 98.2 F (36.8 C) (Oral)  Ht  (1.626 m)  Wt 117 lb 12.8 oz (53.434 kg)  BMI 20.21 kg/m2  SpO2 99%  LMP 03/29/2014  Physical Exam  Physical Exam  Constitutional: She is oriented to person, place, and time and well-developed, well-nourished, and in no distress. No distress.  HENT:  Head: Normocephalic and atraumatic.  Right Ear: External ear normal.  Left Ear: External ear normal.  Nose: Nose normal.  Mouth/Throat: Oropharynx is clear and moist. No oropharyngeal exudate.  Eyes: Conjunctivae are normal. Pupils are equal, round, and reactive to light. Right eye exhibits no discharge. Left eye exhibits no discharge. No scleral icterus.  Neck: Normal range of motion. Neck supple. No thyromegaly present.  Cardiovascular: Normal rate, regular rhythm, normal heart sounds and intact distal pulses.   No murmur heard. Pulmonary/Chest: Effort normal and breath sounds normal. No respiratory distress. She has no wheezes. She has no rales.  Abdominal: Soft. Bowel sounds are normal. She exhibits no distension and no mass. There is no tenderness.  Musculoskeletal: Normal range of motion. She exhibits no edema or tenderness.  Lymphadenopathy:    She has no cervical adenopathy.  Neurological: She is alert and oriented to person, place, and time. She has normal reflexes. No cranial nerve deficit. Coordination normal.  Skin: Skin is warm and dry. No rash noted. She is not diaphoretic.  Psychiatric: Mood, memory and affect normal.    Lab Results  Component Value Date   TSH 0.767 05/19/2014   Lab Results  Component Value Date   WBC 8.6 05/23/2014   HGB 15.0 05/23/2014   HCT 45.3 05/23/2014   MCV 87.5  05/23/2014   PLT 300.0 05/23/2014   Lab Results  Component Value Date   CREATININE 0.85 05/19/2014   BUN 8 05/19/2014   NA 139 05/19/2014   K 4.2 05/19/2014   CL 108 05/19/2014   CO2 20 05/19/2014   Lab Results  Component Value Date   ALT 11 05/19/2014   AST 15 05/19/2014   ALKPHOS 60 05/19/2014   BILITOT 0.5 05/19/2014   Lab Results  Component Value Date   CHOL 156 05/19/2014   Lab Results  Component Value Date   HDL 57 05/19/2014   Lab Results  Component Value Date   LDLCALC 87 05/19/2014   Lab Results  Component Value Date   TRIG 61 05/19/2014   Lab Results  Component Value Date   CHOLHDL 2.7 05/19/2014     Assessment & Plan  Asthma, mild intermittent No recent flares   Reflux Avoid offending foods, start probiotics. Do not eat large meals in late evening and consider raising head of bed. Is improved   Bipolar and related disorder Agrees with diagnosis and is following with  psychiatry. They have requested labs before starting meds, these are ordered today.    Preventative health care Patient encouraged to maintain heart healthy diet, regular exercise, adequate sleep. Consider daily probiotics. Take medications as prescribed. Labs ordered and reviewed.

## 2014-05-28 NOTE — Assessment & Plan Note (Signed)
No recent flares 

## 2014-05-28 NOTE — Assessment & Plan Note (Signed)
Agrees with diagnosis and is following with psychiatry. They have requested labs before starting meds, these are ordered today.

## 2014-05-28 NOTE — Assessment & Plan Note (Signed)
Avoid offending foods, start probiotics. Do not eat large meals in late evening and consider raising head of bed. Is improved

## 2014-05-29 ENCOUNTER — Encounter: Payer: Self-pay | Admitting: General Practice

## 2014-05-31 ENCOUNTER — Other Ambulatory Visit: Payer: 59

## 2014-05-31 LAB — URINALYSIS, ROUTINE W REFLEX MICROSCOPIC
Bilirubin Urine: NEGATIVE
Ketones, ur: NEGATIVE
Leukocytes, UA: NEGATIVE
Nitrite: NEGATIVE
Specific Gravity, Urine: 1.015 (ref 1.000–1.030)
Total Protein, Urine: NEGATIVE
Urine Glucose: NEGATIVE
Urobilinogen, UA: 0.2 (ref 0.0–1.0)
pH: 5.5 (ref 5.0–8.0)

## 2014-06-01 ENCOUNTER — Telehealth: Payer: Self-pay

## 2014-06-01 DIAGNOSIS — F329 Major depressive disorder, single episode, unspecified: Secondary | ICD-10-CM

## 2014-06-01 DIAGNOSIS — F419 Anxiety disorder, unspecified: Principal | ICD-10-CM

## 2014-06-01 MED ORDER — ALPRAZOLAM 0.25 MG PO TABS: 0.2500 mg | ORAL_TABLET | Freq: Every day | ORAL | Status: DC | PRN

## 2014-06-01 NOTE — Telephone Encounter (Signed)
Faxed to [facility].

## 2014-06-01 NOTE — Telephone Encounter (Signed)
Refill x1 

## 2014-06-01 NOTE — Telephone Encounter (Signed)
Pt is requesting refill on Alprazolam. Dr. Abner GreenspanBlyth Pt.   Last OV: 05/23/2014 Last Fill: 04/21/2014 # [id number] 0RF  Please advise.

## 2014-06-01 NOTE — Telephone Encounter (Signed)
Rx printed and signed by Dr. Laury AxonLowne in Dr. Mariel AloeBlyth's absence.

## 2014-06-02 LAB — URINE CYTOLOGY ANCILLARY ONLY
Bacterial vaginitis: NEGATIVE
CANDIDA VAGINITIS: NEGATIVE

## 2014-06-02 LAB — URINE CULTURE
COLONY COUNT: NO GROWTH
Organism ID, Bacteria: NO GROWTH

## 2014-06-04 DIAGNOSIS — R109 Unspecified abdominal pain: Secondary | ICD-10-CM | POA: Insufficient documentation

## 2014-06-04 NOTE — Assessment & Plan Note (Signed)
Awaits new medication from Dr Toni ArthursFuller

## 2014-06-04 NOTE — Assessment & Plan Note (Signed)
Urine culture, BV and yeast all negative. Encouraged increased rest and hydration. Consider viral etiology, intake bland diet and increase fluids, start probiotic and report if no resolution or symptoms worsen

## 2014-06-04 NOTE — Assessment & Plan Note (Signed)
No recent flares 

## 2014-06-04 NOTE — Assessment & Plan Note (Signed)
Is now following with psychiatry and is tolerating meds

## 2014-06-04 NOTE — Progress Notes (Signed)
Marie Smith  [id number] 1995-06-17 06/04/2014      Progress Note-Follow Up  Subjective  Chief Complaint  Chief Complaint  Patient presents with  . Abdominal Pain    along with blood clots    HPI  Patient is a 19 y.o. female in today for routine medical care. She c/o abdominal discomfort more in lowe abdomen than upper bu not exclusive. Mild nausea noted but no vomiting, diarrhea or constipation. Notes some dysuria and hematuria. No sexual activity and not on menstrual cycle. No f/c. Denies CP/palp/SOB/HA/congestion/fevers/GI or GU c/o. Taking meds as prescribed  Past Medical History  Diagnosis Date  . Abdominal pain, epigastric   . ADHD (attention deficit hyperactivity disorder)     ADHD  . Pharyngitis 08/18/2011  . Asthma   . Preventative health care 08/19/2011  . Herpes labialis 08/18/2011  . Anxiety and depression 01/27/2012  . Eczema 02/19/2012  . Otitis media of left ear 02/19/2012  . Pharyngitis 05/26/2012  . Hematuria 05/26/2012  . Reflux   . Sinusitis 05/26/2012  . Asthma, mild intermittent   . Bipolar and related disorder 01/27/2012    History reviewed. No pertinent past surgical history.  Family History  Problem Relation Age of Onset  . Cancer Paternal Grandmother   . Emphysema Paternal Grandmother     smoker  . COPD Paternal Grandmother   . Stroke Paternal Grandmother   . Heart attack Paternal Grandmother   . Mental illness Paternal Grandmother     Bipolar    History   Social History  . Marital Status: Single    Spouse Name: N/A    Number of Children: N/A  . Years of Education: N/A   Occupational History  . Not on file.   Social History Main Topics  . Smoking status: Never Smoker   . Smokeless tobacco: Never Used  . Alcohol Use: No  . Drug Use: No  . Sexual Activity: No   Other Topics Concern  . Not on file   Social History Narrative   11th grade    Current Outpatient Prescriptions on File Prior to Visit  Medication Sig Dispense  Refill  . albuterol (PROVENTIL HFA;VENTOLIN HFA) 108 (90 BASE) MCG/ACT inhaler Inhale 2 puffs into the lungs every 6 (six) hours as needed for wheezing or shortness of breath. 1 Inhaler 5  . albuterol (PROVENTIL) (2.5 MG/3ML) 0.083% nebulizer solution Take 3 mLs (2.5 mg total) by nebulization every 6 (six) hours as needed for wheezing or shortness of breath. 150 mL 1  . amphetamine-dextroamphetamine (ADDERALL XR) 10 MG 24 hr capsule Take 1 capsule (10 mg total) by mouth daily. RX for 04-21-14 30 capsule 0  . beclomethasone (QVAR) 80 MCG/ACT inhaler Inhale 1 puff into the lungs 2 (two) times daily. 1 Inhaler 3  . cetirizine (ZYRTEC) 10 MG tablet Take 10 mg by mouth daily.    Marland Kitchen. HYDROcodone-homatropine (HYCODAN) 5-1.5 MG/5ML syrup Take 5 mLs by mouth every 8 (eight) hours as needed for cough. 120 mL 0  . promethazine-dextromethorphan (PROMETHAZINE-DM) 6.25-15 MG/5ML syrup Take 5 mLs by mouth 4 (four) times daily as needed. 240 mL 0  . ranitidine (ZANTAC) 150 MG tablet Take 1 tablet (150 mg total) by mouth at bedtime. qhs prn 30 tablet 5   No current facility-administered medications on file prior to visit.    No Known Allergies  Review of Systems  ROS   See HPI otherwise ROS negative for any concerning symptoms     Objective  BP  121/83 mmHg  Pulse 93  Temp(Src) 98 F (36.7 C) (Oral)  Ht  (1.626 m)  Wt 128 lb (58.06 kg)  BMI 21.96 kg/m2  SpO2 99%  LMP 03/29/2014  Physical Exam  Physical Exam  General appearance: alert, well appearing, and in no distress and normal appearing weight. HEENT: NCAT, PERRLA, MMM, Neck supple no thyromegaly CV: RRR no M Lungs: CTA b/l Abd: Soft NTND, No masses Extrem: No C/C/E Neuro: oriented x 3  Lab Results  Component Value Date   TSH 0.767 05/19/2014   Lab Results  Component Value Date   WBC 8.6 05/23/2014   HGB 15.0 05/23/2014   HCT 45.3 05/23/2014   MCV 87.5 05/23/2014   PLT 300.0 05/23/2014   Lab Results  Component Value  Date   CREATININE 0.85 05/19/2014   BUN 8 05/19/2014   NA 139 05/19/2014   K 4.2 05/19/2014   CL 108 05/19/2014   CO2 20 05/19/2014   Lab Results  Component Value Date   ALT 11 05/19/2014   AST 15 05/19/2014   ALKPHOS 60 05/19/2014   BILITOT 0.5 05/19/2014   Lab Results  Component Value Date   CHOL 156 05/19/2014   Lab Results  Component Value Date   HDL 57 05/19/2014   Lab Results  Component Value Date   LDLCALC 87 05/19/2014   Lab Results  Component Value Date   TRIG 61 05/19/2014   Lab Results  Component Value Date   CHOLHDL 2.7 05/19/2014     Assessment & Plan  Asthma, mild intermittent No recent flares   ADHD (attention deficit hyperactivity disorder) Is now following with psychiatry and is tolerating meds   Bipolar and related disorder Awaits new medication from Dr Toni Arthurs   Abdominal pain Urine culture, BV and yeast all negative. Encouraged increased rest and hydration. Consider viral etiology, intake bland diet and increase fluids, start probiotic and report if no resolution or symptoms worsen

## 2014-07-05 ENCOUNTER — Other Ambulatory Visit: Payer: Self-pay | Admitting: Family Medicine

## 2014-07-05 DIAGNOSIS — F988 Other specified behavioral and emotional disorders with onset usually occurring in childhood and adolescence: Secondary | ICD-10-CM

## 2014-07-05 MED ORDER — AMPHETAMINE-DEXTROAMPHET ER 10 MG PO CP24: 10.0000 mg | ORAL_CAPSULE | Freq: Every day | ORAL | Status: DC

## 2014-07-05 NOTE — Telephone Encounter (Signed)
Rx's printed and placed in Dr. Blyth red folder for review and signature.   

## 2014-07-05 NOTE — Telephone Encounter (Signed)
Rx's signed by Dr. Abner GreenspanBlyth.  Left a message for call back

## 2014-07-05 NOTE — Telephone Encounter (Signed)
Caller name: Kameka Relation to pt: self Call back number: [phone] Pharmacy:  Reason for call:   Requesting a new adderall rx

## 2014-07-05 NOTE — Telephone Encounter (Signed)
She can have a refill on her Adderall, same strength, same sig. Same number. Can write her a February and a March prescription.

## 2014-07-06 NOTE — Telephone Encounter (Signed)
Rx's placed up front, ready for pick up.  Pt is aware.

## 2014-07-10 ENCOUNTER — Other Ambulatory Visit: Payer: Self-pay | Admitting: Family Medicine

## 2014-07-11 NOTE — Telephone Encounter (Signed)
Faxed hardcopy for [facility]razolam to [facility]

## 2014-08-17 ENCOUNTER — Ambulatory Visit: Payer: Self-pay | Admitting: Family Medicine

## 2014-08-25 ENCOUNTER — Ambulatory Visit: Payer: Self-pay | Admitting: Family Medicine

## 2014-12-05 ENCOUNTER — Telehealth: Payer: Self-pay | Admitting: Family Medicine

## 2014-12-05 NOTE — Telephone Encounter (Signed)
Recd call from Meridith at [facility] stating she sent req for PA on Adderall for pt by accident. Please disregard!!!

## 2015-02-01 ENCOUNTER — Encounter: Payer: Self-pay | Admitting: Family Medicine

## 2015-02-01 ENCOUNTER — Ambulatory Visit (INDEPENDENT_AMBULATORY_CARE_PROVIDER_SITE_OTHER): Payer: 59 | Admitting: Family Medicine

## 2015-02-01 ENCOUNTER — Encounter: Payer: Self-pay | Admitting: General Practice

## 2015-02-01 VITALS — BP 106/74 | HR 98 | Temp 98.3°F | Resp 17 | Wt 118.4 lb

## 2015-02-01 DIAGNOSIS — J029 Acute pharyngitis, unspecified: Secondary | ICD-10-CM

## 2015-02-01 LAB — CBC WITH DIFFERENTIAL/PLATELET
BASOS ABS: 0 10*3/uL (ref 0.0–0.1)
Basophils Relative: 0.2 % (ref 0.0–3.0)
EOS ABS: 0.2 10*3/uL (ref 0.0–0.7)
Eosinophils Relative: 1.7 % (ref 0.0–5.0)
HCT: 46.3 % — ABNORMAL HIGH (ref 36.0–46.0)
Hemoglobin: 15.8 g/dL — ABNORMAL HIGH (ref 12.0–15.0)
LYMPHS ABS: 1.4 10*3/uL (ref 0.7–4.0)
LYMPHS PCT: 10.8 % — AB (ref 12.0–46.0)
MCHC: 34 g/dL (ref 30.0–36.0)
MCV: 89.6 fl (ref 78.0–100.0)
MONO ABS: 1.4 10*3/uL — AB (ref 0.1–1.0)
Monocytes Relative: 10.7 % (ref 3.0–12.0)
NEUTROS ABS: 9.8 10*3/uL — AB (ref 1.4–7.7)
NEUTROS PCT: 76.6 % (ref 43.0–77.0)
Platelets: 284 10*3/uL (ref 150.0–400.0)
RBC: 5.17 Mil/uL — ABNORMAL HIGH (ref 3.87–5.11)
RDW: 13.7 % (ref 11.5–14.6)
WBC: 12.7 10*3/uL — ABNORMAL HIGH (ref 4.5–10.5)

## 2015-02-01 LAB — POCT RAPID STREP A (OFFICE): Rapid Strep A Screen: NEGATIVE

## 2015-02-01 MED ORDER — PROMETHAZINE-DM 6.25-15 MG/5ML PO SYRP: 5.0000 mL | ORAL_SOLUTION | Freq: Four times a day (QID) | ORAL | Status: DC | PRN

## 2015-02-01 NOTE — Progress Notes (Signed)
   Subjective:    Patient ID: Marie Smith, female    DOB: 16-Jul-1995, 19 y.o.   MRN: [id number]  HPI Sore throat- sxs started 2-3 days ago w/ sore neck.  + sore throat, difficulty speaking or swallowing due to pain.  + nasal congestion.  + cough- worse at night.  Cough is not productive.  + low grade temps, 100.7 this AM.  No known sick contacts.  No N/V.  No HA.  Mild sinus pressure.  Pt reports neck is very sore and stiff.  States LNs were visibly swollen yesterday.   Review of Systems For ROS see HPI     Objective:   Physical Exam  Constitutional: She is oriented to person, place, and time. She appears well-developed and well-nourished. No distress.  HENT:  Head: Normocephalic and atraumatic.  Mild TTP over frontal sinuses Copious PND No tonsillar erythema or exudate  Eyes: Conjunctivae and EOM are normal. Pupils are equal, round, and reactive to light.  Cardiovascular: Normal rate, regular rhythm, normal heart sounds and intact distal pulses.   Pulmonary/Chest: Breath sounds normal. No respiratory distress. She has no wheezes. She has no rales.  Lymphadenopathy:    She has cervical adenopathy (shotty anterior and posterior cervical LAD).  Neurological: She is alert and oriented to person, place, and time.  Skin: Skin is warm and dry.  Psychiatric: She has a normal mood and affect. Her behavior is normal. Thought content normal.  Vitals reviewed.         Assessment & Plan:

## 2015-02-01 NOTE — Progress Notes (Signed)
Pre visit review using our clinic review tool, if applicable. No additional management support is needed unless otherwise documented below in the visit note. 

## 2015-02-01 NOTE — Patient Instructions (Signed)
Follow up as needed No evidence of strep- this is good news! We will notify you of your lab results and make any changes if needed (this includes your mono screen) Drink plenty of fluids Alternate tylenol/ibuprofen as needed for pain/fever Heating pad for neck stiffness If symptoms change or worsen, please let me know! Hang in there!!

## 2015-02-02 ENCOUNTER — Telehealth: Payer: Self-pay | Admitting: Family Medicine

## 2015-02-02 LAB — MONONUCLEOSIS SCREEN: Mono Screen: NEGATIVE

## 2015-02-02 NOTE — Assessment & Plan Note (Signed)
New.  No evidence of strep on rapid test.  + sore throat, neck pain, low grade temps.  + cough- goes against strep.  + fatigue.  Check labs to r/o mono.  Otherwise consistent w/ viral illness.  Reviewed supportive care and red flags that should prompt return.  Note provided for work.  Pt expressed understanding and is in agreement w/ plan.

## 2015-02-02 NOTE — Telephone Encounter (Signed)
Opened in error

## 2015-02-05 ENCOUNTER — Encounter: Payer: Self-pay | Admitting: General Practice

## 2015-02-05 ENCOUNTER — Other Ambulatory Visit: Payer: Self-pay | Admitting: Family Medicine

## 2015-02-05 ENCOUNTER — Other Ambulatory Visit (INDEPENDENT_AMBULATORY_CARE_PROVIDER_SITE_OTHER): Payer: 59

## 2015-02-05 DIAGNOSIS — J029 Acute pharyngitis, unspecified: Secondary | ICD-10-CM

## 2015-02-05 LAB — CBC WITH DIFFERENTIAL/PLATELET
BASOS ABS: 0.1 10*3/uL (ref 0.0–0.1)
BASOS PCT: 0.6 % (ref 0.0–3.0)
EOS ABS: 1 10*3/uL — AB (ref 0.0–0.7)
Eosinophils Relative: 8.4 % — ABNORMAL HIGH (ref 0.0–5.0)
HCT: 48.6 % — ABNORMAL HIGH (ref 36.0–46.0)
Hemoglobin: 16.4 g/dL — ABNORMAL HIGH (ref 12.0–15.0)
LYMPHS ABS: 2.3 10*3/uL (ref 0.7–4.0)
Lymphocytes Relative: 20.1 % (ref 12.0–46.0)
MCHC: 33.7 g/dL (ref 30.0–36.0)
MCV: 89.5 fl (ref 78.0–100.0)
MONOS PCT: 7 % (ref 3.0–12.0)
Monocytes Absolute: 0.8 10*3/uL (ref 0.1–1.0)
NEUTROS ABS: 7.3 10*3/uL (ref 1.4–7.7)
Neutrophils Relative %: 63.9 % (ref 43.0–77.0)
Platelets: 380 10*3/uL (ref 150.0–400.0)
RBC: 5.43 Mil/uL — ABNORMAL HIGH (ref 3.87–5.11)
RDW: 13.1 % (ref 11.5–14.6)
WBC: 11.4 10*3/uL — ABNORMAL HIGH (ref 4.5–10.5)

## 2015-02-05 LAB — MONONUCLEOSIS SCREEN: MONO SCREEN: NEGATIVE

## 2015-03-30 ENCOUNTER — Telehealth: Payer: Self-pay | Admitting: Family Medicine

## 2015-03-30 DIAGNOSIS — F988 Other specified behavioral and emotional disorders with onset usually occurring in childhood and adolescence: Secondary | ICD-10-CM

## 2015-03-30 MED ORDER — AMPHETAMINE-DEXTROAMPHET ER 10 MG PO CP24: 10.0000 mg | ORAL_CAPSULE | Freq: Every day | ORAL | Status: DC

## 2015-03-30 NOTE — Telephone Encounter (Signed)
I have not seen her since January but she has been seen in the office recently so she can have a refill once but needs an appt for more

## 2015-03-30 NOTE — Telephone Encounter (Signed)
Relation to ZO:XWRUpt:self Call back [phone]   Reason for call:  Patient requesting a refill amphetamine-dextroamphetamine (ADDERALL XR) 10 MG 24 hr capsule

## 2015-03-30 NOTE — Telephone Encounter (Signed)
Requesting:  Adderall Contract  none UDS   none Last OV  05/23/14 Last Refill    #30 with 0 refills on 07/05/2014  Please Advise

## 2015-03-30 NOTE — Telephone Encounter (Signed)
Printed and PCP signed. Called the patient informed to pickup at the front desk and to schedule appointment for OV in order to continue getting refills.

## 2015-05-13 ENCOUNTER — Encounter (HOSPITAL_BASED_OUTPATIENT_CLINIC_OR_DEPARTMENT_OTHER): Payer: Self-pay | Admitting: Emergency Medicine

## 2015-05-13 DIAGNOSIS — L0211 Cutaneous abscess of neck: Secondary | ICD-10-CM | POA: Insufficient documentation

## 2015-05-13 DIAGNOSIS — R220 Localized swelling, mass and lump, head: Secondary | ICD-10-CM | POA: Diagnosis present

## 2015-05-13 DIAGNOSIS — F17210 Nicotine dependence, cigarettes, uncomplicated: Secondary | ICD-10-CM | POA: Insufficient documentation

## 2015-05-13 DIAGNOSIS — J4520 Mild intermittent asthma, uncomplicated: Secondary | ICD-10-CM | POA: Insufficient documentation

## 2015-05-13 NOTE — ED Notes (Signed)
Patient reports neck pain due to lump on the back of her neck.  States that this began this morning and that it has gotten bigger and caused increased pain which radiates down her back.

## 2015-05-14 ENCOUNTER — Emergency Department (HOSPITAL_BASED_OUTPATIENT_CLINIC_OR_DEPARTMENT_OTHER)
Admission: EM | Admit: 2015-05-14 | Discharge: 2015-05-14 | Discharge status: Against Medical Advice | Payer: 59 | Attending: Emergency Medicine | Admitting: Emergency Medicine

## 2015-05-16 ENCOUNTER — Ambulatory Visit: Payer: 59 | Admitting: Physician Assistant

## 2015-06-01 MED FILL — NITROFURANTOIN MONO-MCR 100: 100 | 7 days supply | Qty: 14 | Fill #0

## 2015-07-10 ENCOUNTER — Telehealth: Payer: Self-pay | Admitting: Family Medicine

## 2015-07-10 NOTE — Telephone Encounter (Signed)
PATIENT DECLINED FLU SHOT °

## 2015-07-10 NOTE — Telephone Encounter (Signed)
Declined flu shot in Health Maintenance

## 2017-08-09 ENCOUNTER — Inpatient Hospital Stay (HOSPITAL_COMMUNITY)
Admission: AD | Admit: 2017-08-09 | Discharge: 2017-08-10 | Disposition: A | Discharge status: Home / Self Care | Payer: 59 | Source: Ambulatory Visit | Attending: Obstetrics and Gynecology | Admitting: Obstetrics and Gynecology

## 2017-08-09 ENCOUNTER — Encounter (HOSPITAL_COMMUNITY): Payer: Self-pay | Admitting: *Deleted

## 2017-08-09 ENCOUNTER — Inpatient Hospital Stay (HOSPITAL_COMMUNITY): Payer: 59

## 2017-08-09 ENCOUNTER — Other Ambulatory Visit: Payer: Self-pay

## 2017-08-09 DIAGNOSIS — O26891 Other specified pregnancy related conditions, first trimester: Secondary | ICD-10-CM

## 2017-08-09 DIAGNOSIS — Z3A00 Weeks of gestation of pregnancy not specified: Secondary | ICD-10-CM | POA: Insufficient documentation

## 2017-08-09 DIAGNOSIS — O200 Threatened abortion: Secondary | ICD-10-CM

## 2017-08-09 DIAGNOSIS — O99341 Other mental disorders complicating pregnancy, first trimester: Secondary | ICD-10-CM | POA: Insufficient documentation

## 2017-08-09 DIAGNOSIS — F909 Attention-deficit hyperactivity disorder, unspecified type: Secondary | ICD-10-CM | POA: Insufficient documentation

## 2017-08-09 DIAGNOSIS — Z79899 Other long term (current) drug therapy: Secondary | ICD-10-CM | POA: Insufficient documentation

## 2017-08-09 DIAGNOSIS — F419 Anxiety disorder, unspecified: Secondary | ICD-10-CM | POA: Insufficient documentation

## 2017-08-09 DIAGNOSIS — F329 Major depressive disorder, single episode, unspecified: Secondary | ICD-10-CM | POA: Insufficient documentation

## 2017-08-09 DIAGNOSIS — O4690 Antepartum hemorrhage, unspecified, unspecified trimester: Secondary | ICD-10-CM

## 2017-08-09 DIAGNOSIS — F17210 Nicotine dependence, cigarettes, uncomplicated: Secondary | ICD-10-CM | POA: Insufficient documentation

## 2017-08-09 DIAGNOSIS — O99611 Diseases of the digestive system complicating pregnancy, first trimester: Secondary | ICD-10-CM | POA: Insufficient documentation

## 2017-08-09 DIAGNOSIS — R109 Unspecified abdominal pain: Secondary | ICD-10-CM

## 2017-08-09 DIAGNOSIS — O99331 Smoking (tobacco) complicating pregnancy, first trimester: Secondary | ICD-10-CM | POA: Insufficient documentation

## 2017-08-09 DIAGNOSIS — K219 Gastro-esophageal reflux disease without esophagitis: Secondary | ICD-10-CM | POA: Insufficient documentation

## 2017-08-09 LAB — URINALYSIS, ROUTINE W REFLEX MICROSCOPIC
Bacteria, UA: NONE SEEN
Bilirubin Urine: NEGATIVE
Glucose, UA: NEGATIVE mg/dL
Ketones, ur: NEGATIVE mg/dL
Leukocytes, UA: NEGATIVE
Nitrite: NEGATIVE
Protein, ur: NEGATIVE mg/dL
Specific Gravity, Urine: 1.029 (ref 1.005–1.030)
pH: 5 (ref 5.0–8.0)

## 2017-08-09 LAB — CBC
HCT: 39.2 % (ref 36.0–46.0)
Hemoglobin: 13.6 g/dL (ref 12.0–15.0)
MCH: 30 pg (ref 26.0–34.0)
MCHC: 34.7 g/dL (ref 30.0–36.0)
MCV: 86.5 fL (ref 78.0–100.0)
Platelets: 256 10*3/uL (ref 150–400)
RBC: 4.53 MIL/uL (ref 3.87–5.11)
RDW: 12.7 % (ref 11.5–15.5)
WBC: 11.1 10*3/uL — ABNORMAL HIGH (ref 4.0–10.5)

## 2017-08-09 LAB — WET PREP, GENITAL
Clue Cells Wet Prep HPF POC: NONE SEEN
Sperm: NONE SEEN
Trich, Wet Prep: NONE SEEN
WBC, Wet Prep HPF POC: NONE SEEN
Yeast Wet Prep HPF POC: NONE SEEN

## 2017-08-09 LAB — HCG, QUANTITATIVE, PREGNANCY: hCG, Beta Chain, Quant, S: 2298 m[IU]/mL — ABNORMAL HIGH (ref <5)

## 2017-08-09 LAB — POCT PREGNANCY, URINE: PREG TEST UR: POSITIVE — AB

## 2017-08-09 NOTE — MAU Provider Note (Signed)
Chief Complaint: Abdominal Pain; Possible Pregnancy; and Vaginal Bleeding   First Provider Initiated Contact with Patient 08/09/17 2226     SUBJECTIVE HPI: Marie Smith is a 22 y.o. G1P0 at Unknown [place] by LMP who presents to maternity admissions reporting abdominal pain and vaginal bleeding. She reports being on BCP- not taking them regularly with hx of irregular periods. She reports LMP being sometime in November she believes. She reports brown stringy discharge started 2 days ago and she thought she was about to start her period. Yesterday she started bleeding and placed a tampon, she reports having to change her tampon every 2 hours. This morning she woke up to severe lower abdominal cramping, rated cramping 10/10- she felt as if she was "contracting" she took ibuprofen and hot bath with no relief, associated symptoms included an increase in vaginal bleeding. Around 1500 she reports a large clot and tissue on toilet paper, the abdominal cramping and vaginal bleeding decreased after passing of tissue. She brought tissue into hospital- sent to pathology. She denies urinary symptoms, h/a, dizziness, lightheadedness, n/v, or fever/chills.    Past Medical History:  Diagnosis Date  . Abdominal pain, epigastric   . ADHD (attention deficit hyperactivity disorder)    ADHD  . Anxiety and depression 01/27/2012  . Asthma   . Asthma, mild intermittent   . Bipolar and related disorder (HCC) 01/27/2012  . Eczema 02/19/2012  . Hematuria 05/26/2012  . Herpes labialis 08/18/2011  . Otitis media of left ear 02/19/2012  . Pharyngitis 08/18/2011  . Pharyngitis 05/26/2012  . Preventative health care 08/19/2011  . Reflux   . Sinusitis 05/26/2012   History reviewed. No pertinent surgical history. Social History   Socioeconomic History  . Marital status: Significant Other    Spouse name: Not on file  . Number of children: Not on file  . Years of education: Not on file  . Highest education level: Not on file   Occupational History  . Not on file  Social Needs  . Financial resource strain: Not on file  . Food insecurity:    Worry: Not on file    Inability: Not on file  . Transportation needs:    Medical: Not on file    Non-medical: Not on file  Tobacco Use  . Smoking status: Current Every Day Smoker    Packs/day: 0.50    Types: Cigarettes  . Smokeless tobacco: Never Used  . Tobacco comment: socially  Substance and Sexual Activity  . Alcohol use: Yes    Comment: occassional  . Drug use: No  . Sexual activity: Yes    Birth control/protection: Pill  Lifestyle  . Physical activity:    Days per week: Not on file    Minutes per session: Not on file  . Stress: Not on file  Relationships  . Social connections:    Talks on phone: Not on file    Gets together: Not on file    Attends religious service: Not on file    Active member of club or organization: Not on file    Attends meetings of clubs or organizations: Not on file    Relationship status: Not on file  . Intimate partner violence:    Fear of current or ex partner: Not on file    Emotionally abused: Not on file    Physically abused: Not on file    Forced sexual activity: Not on file  Other Topics Concern  . Not on file  Social History Narrative  11th grade   No current facility-administered medications on file prior to encounter.    Current Outpatient Medications on File Prior to Encounter  Medication Sig Dispense Refill  . albuterol (PROVENTIL HFA;VENTOLIN HFA) 108 (90 BASE) MCG/ACT inhaler Inhale 2 puffs into the lungs every 6 (six) hours as needed for wheezing or shortness of breath. 1 Inhaler 5  . amphetamine-dextroamphetamine (ADDERALL XR) 10 MG 24 hr capsule Take 1 capsule (10 mg total) by mouth daily. Do not fill until 08/03/14 30 capsule 0  . ibuprofen (ADVIL,MOTRIN) 600 MG tablet Take 600 mg by mouth every 6 (six) hours as needed.    Marland Kitchen albuterol (PROVENTIL) (2.5 MG/3ML) 0.083% nebulizer solution Take 3 mLs (2.5 mg  total) by nebulization every 6 (six) hours as needed for wheezing or shortness of breath. 150 mL 1  . ALPRAZolam (XANAX) 0.25 MG tablet TAKE 1 TABLET BY MOUTH DAILY AS NEEDED FOR SLEEP OR ANXIETY 20 tablet 0  . beclomethasone (QVAR) 80 MCG/ACT inhaler Inhale 1 puff into the lungs 2 (two) times daily. 1 Inhaler 3  . cetirizine (ZYRTEC) 10 MG tablet Take 10 mg by mouth daily.    Marland Kitchen lamoTRIgine (LAMICTAL) 25 MG tablet Take 25 mg by mouth as directed.    . promethazine-dextromethorphan (PROMETHAZINE-DM) 6.25-15 MG/5ML syrup Take 5 mLs by mouth 4 (four) times daily as needed. 240 mL 0  . ranitidine (ZANTAC) 150 MG tablet Take 1 tablet (150 mg total) by mouth at bedtime. qhs prn 30 tablet 5   No Known Allergies  ROS:  Review of Systems  Constitutional: Negative.   Respiratory: Negative.   Cardiovascular: Negative.   Gastrointestinal: Positive for abdominal pain. Negative for constipation, diarrhea, nausea and vomiting.  Genitourinary: Positive for menstrual problem and vaginal bleeding. Negative for difficulty urinating, dysuria, flank pain, frequency and urgency.  Musculoskeletal: Negative.    I have reviewed patient's Past Medical Hx, Surgical Hx, Family Hx, Social Hx, medications and allergies.   Physical Exam   Patient Vitals for the past 24 hrs:  BP Temp Temp src Pulse Resp SpO2 Weight  08/10/17 0046 111/71 - - 77 16 - -  08/09/17 2206 - - - - - - 108 lb (49 kg)  08/09/17 2155 112/80 98.7 F (37.1 C) Oral 85 18 100 % -   Constitutional: Well-developed, well-nourished female in no acute distress.  Cardiovascular: normal rate Respiratory: normal effort GI: Abd soft, non-tender. Pos BS x 4 MS: Extremities nontender, no edema, normal ROM Neurologic: Alert and oriented x 4.  GU: Neg CVAT.  PELVIC EXAM: Cervix pink, visually open, without lesion, small bright red vaginal bleeding noted from cervical os, no clots present, vaginal walls and external genitalia normal Bimanual exam: Cervix  1cm/long/high, firm, anterior, neg CMT, uterus nontender, nonenlarged, adnexa without tenderness, enlargement, or mass   LAB RESULTS Results for orders placed or performed during the hospital encounter of 08/09/17 (from the past 24 hour(s))  Urinalysis, Routine w reflex microscopic     Status: Abnormal   Collection Time: 08/09/17 10:05 PM  Result Value Ref Range   Color, Urine YELLOW YELLOW   APPearance CLEAR CLEAR   Specific Gravity, Urine 1.029 1.005 - 1.030   pH 5.0 5.0 - 8.0   Glucose, UA NEGATIVE NEGATIVE mg/dL   Hgb urine dipstick LARGE (A) NEGATIVE   Bilirubin Urine NEGATIVE NEGATIVE   Ketones, ur NEGATIVE NEGATIVE mg/dL   Protein, ur NEGATIVE NEGATIVE mg/dL   Nitrite NEGATIVE NEGATIVE   Leukocytes, UA NEGATIVE NEGATIVE  RBC / HPF TOO NUMEROUS TO COUNT 0 - 5 RBC/hpf   WBC, UA 0-5 0 - 5 WBC/hpf   Bacteria, UA NONE SEEN NONE SEEN   Squamous Epithelial / LPF 0-5 (A) NONE SEEN   Mucus PRESENT   Pregnancy, urine POC     Status: Abnormal   Collection Time: 08/09/17 10:22 PM  Result Value Ref Range   Preg Test, Ur POSITIVE (A) NEGATIVE  Wet prep, genital     Status: None   Collection Time: 08/09/17 10:37 PM  Result Value Ref Range   Yeast Wet Prep HPF POC NONE SEEN NONE SEEN   Trich, Wet Prep NONE SEEN NONE SEEN   Clue Cells Wet Prep HPF POC NONE SEEN NONE SEEN   WBC, Wet Prep HPF POC NONE SEEN NONE SEEN   Sperm NONE SEEN   ABO/Rh     Status: None   Collection Time: 08/09/17 10:39 PM  Result Value Ref Range   ABO/RH(D)      O NEG Performed at [facility], [address], [place], [place] [address]   hCG, quantitative, pregnancy     Status: Abnormal   Collection Time: 08/09/17 10:39 PM  Result Value Ref Range   hCG, Beta Chain, Quant, S 2,298 (H) <5 mIU/mL  CBC     Status: Abnormal   Collection Time: 08/09/17 10:42 PM  Result Value Ref Range   WBC 11.1 (H) 4.0 - 10.5 K/uL   RBC 4.53 3.87 - 5.11 MIL/uL   Hemoglobin 13.6 12.0 - 15.0 g/dL   HCT 60.4 54.0 - 98.1  %   MCV 86.5 78.0 - 100.0 fL   MCH 30.0 26.0 - 34.0 pg   MCHC 34.7 30.0 - 36.0 g/dL   RDW 19.1 47.8 - 29.5 %   Platelets 256 150 - 400 K/uL  Rh IG workup (includes ABO/Rh)     Status: None (Preliminary result)   Collection Time: 08/09/17 10:52 PM  Result Value Ref Range   Gestational Age(Wks) 9    ABO/RH(D) O NEG    Antibody Screen NEG    Unit Number [id number]    Blood Component Type RHIG    Unit division 00    Status of Unit ISSUED    Transfusion Status      OK TO TRANSFUSE Performed at [facility], [address], [place], [place] [address]     IMAGING US Ob Less Than 14 Weeks With Ob Transvaginal  Result Date: 08/09/2017 CLINICAL DATA:  22 year old female with cramping and vaginal bleeding. EXAM: OBSTETRIC <14 WK [place] AND TRANSVAGINAL OB US TECHNIQUE: Both transabdominal and transvaginal ultrasound examinations were performed for complete evaluation of the gestation as well as the maternal uterus, adnexal regions, and pelvic cul-de-sac. Transvaginal technique was performed to assess early pregnancy. COMPARISON:  None. FINDINGS: The uterus is anteverted and appears unremarkable. The endometrium is heterogeneous and measures 11 mm in thickness. No intrauterine pregnancy identified. If the patient has a positive HCG levels findings consistent with pregnancy of unknown location and differentials include an early pregnancy, recent spontaneous miscarriage, or an ectopic pregnancy. Clinical correlation recommended. The ovaries appear unremarkable. The right ovary measures 3.0 x 2.2 x 2.1 cm and the left ovary measures 1.2 x 0.9 x 1.4 cm. No free fluid within the pelvis. IMPRESSION: Heterogeneous and thickened endometrium. No intrauterine pregnancy identified. Correlation with clinical exam and HCG levels recommended. Electronically Signed   By: Elgie Collard M.D.   On: 08/09/2017 23:55    MAU Management/MDM:  Orders Placed This Encounter  Procedures  . Wet prep, genital  . US OB  LESS THAN 14 WEEKS WITH OB TRANSVAGINAL  . Urinalysis, Routine w reflex microscopic  . CBC  . hCG, quantitative, pregnancy  . Pregnancy, urine POC  . ABO/Rh  CBC- WNL ABO/Rh- O neg, Rhogam workup initiated and Rhophylac given IM Wet prep- negative Surg Path- pending  GC/C- pending   Meds ordered this encounter  Medications  . rho (d) immune globulin (RHIG/RHOPHYLAC) injection 300 mcg   Consult Dr Renaldo Fiddler with assessment and management- okay with discharge home, follow up in the office in 2 days for repeat lab work.    Pt discharged with strict miscarriage precautions. Discussed reasons to return to MAU. Work note given to patient. Patient verbalizes understanding.   ASSESSMENT 1. Threatened miscarriage in early pregnancy   2. Abdominal pain during pregnancy in first trimester   3. Vaginal bleeding during pregnancy     PLAN Discharge home. Pt stable prior to discharge Follow up in the office in 2 days for repeat lab work  Return to MAU as needed for worsening symptoms    Allergies as of 08/10/2017   No Known Allergies     Medication List    TAKE these medications   albuterol 108 (90 Base) MCG/ACT inhaler Commonly known as:  PROVENTIL HFA;VENTOLIN HFA Inhale 2 puffs into the lungs every 6 (six) hours as needed for wheezing or shortness of breath.   albuterol (2.5 MG/3ML) 0.083% nebulizer solution Commonly known as:  PROVENTIL Take 3 mLs (2.5 mg total) by nebulization every 6 (six) hours as needed for wheezing or shortness of breath.   ALPRAZolam 0.25 MG tablet Commonly known as:  XANAX TAKE 1 TABLET BY MOUTH DAILY AS NEEDED FOR SLEEP OR ANXIETY   amphetamine-dextroamphetamine 10 MG 24 hr capsule Commonly known as:  ADDERALL XR Take 1 capsule (10 mg total) by mouth daily. Do not fill until 08/03/14   beclomethasone 80 MCG/ACT inhaler Commonly known as:  QVAR Inhale 1 puff into the lungs 2 (two) times daily.   cetirizine 10 MG tablet Commonly known as:   ZYRTEC Take 10 mg by mouth daily.   ibuprofen 600 MG tablet Commonly known as:  ADVIL,MOTRIN Take 600 mg by mouth every 6 (six) hours as needed.   lamoTRIgine 25 MG tablet Commonly known as:  LAMICTAL Take 25 mg by mouth as directed.   promethazine-dextromethorphan 6.25-15 MG/5ML syrup Commonly known as:  PROMETHAZINE-DM Take 5 mLs by mouth 4 (four) times daily as needed.   ranitidine 150 MG tablet Commonly known as:  ZANTAC Take 1 tablet (150 mg total) by mouth at bedtime. qhs prn       Steward Drone  Certified Nurse-Midwife 08/09/2017  11:58 PM

## 2017-08-09 NOTE — MAU Note (Signed)
2 days pt had brown stringy discharge.Pt reports severe cramping today. Took ibuprofen and hot bath.She thought she was starting her period. Had severe pain along with bleeding then passed some tissue. On BCP. Last period in November. Has a history of irregular periods.

## 2017-08-10 DIAGNOSIS — O200 Threatened abortion: Secondary | ICD-10-CM | POA: Diagnosis not present

## 2017-08-10 DIAGNOSIS — R109 Unspecified abdominal pain: Secondary | ICD-10-CM

## 2017-08-10 DIAGNOSIS — O26891 Other specified pregnancy related conditions, first trimester: Secondary | ICD-10-CM

## 2017-08-10 LAB — GC/CHLAMYDIA PROBE AMP (~~LOC~~) NOT AT ARMC
Chlamydia: NEGATIVE
Neisseria Gonorrhea: NEGATIVE

## 2017-08-10 LAB — ABO/RH: ABO/RH(D): O NEG

## 2017-08-10 MED ORDER — RHO D IMMUNE GLOBULIN 1500 UNIT/2ML IJ SOSY
300.0000 ug | PREFILLED_SYRINGE | Freq: Once | INTRAMUSCULAR | Status: AC
Start: 2017-08-10 — End: 2017-08-10
  Administered 2017-08-10: 300 ug via INTRAMUSCULAR
  Filled 2017-08-10: qty 2

## 2017-08-11 LAB — RH IG WORKUP (INCLUDES ABO/RH)
ABO/RH(D): O NEG
Antibody Screen: NEGATIVE
Gestational Age(Wks): 9
Unit division: 0

## 2018-03-22 ENCOUNTER — Encounter: Payer: Self-pay | Admitting: Family Medicine

## 2018-03-22 ENCOUNTER — Ambulatory Visit (INDEPENDENT_AMBULATORY_CARE_PROVIDER_SITE_OTHER): Payer: 59 | Admitting: Family Medicine

## 2018-03-22 VITALS — BP 98/62 | HR 92 | Temp 98.6°F | Ht 64.0 in | Wt 116.0 lb

## 2018-03-22 DIAGNOSIS — J029 Acute pharyngitis, unspecified: Secondary | ICD-10-CM

## 2018-03-22 LAB — POCT RAPID STREP A (OFFICE): Rapid Strep A Screen: NEGATIVE

## 2018-03-22 MED ORDER — METHYLPREDNISOLONE ACETATE 80 MG/ML IJ SUSP
80.0000 mg | Freq: Once | INTRAMUSCULAR | Status: AC
  Administered 2018-03-22: 80 mg via INTRAMUSCULAR

## 2018-03-22 NOTE — Progress Notes (Signed)
Chief Complaint  Patient presents with  . Sore Throat    congestion  . Cough    Tommy Medal here for URI complaints.  Duration: 3 days  Associated symptoms: sinus congestion, rhinorrhea, itchy watery eyes, sore throat, wheezing, shortness of breath and myalgia Denies: ear pain, ear drainage and fevers Treatment to date: Ibuprofen, allergy meds Sick contacts: Yes  ROS:  Const: Denies fevers HEENT: As noted in HPI Lungs: +SOB  Past Medical History:  Diagnosis Date  . Abdominal pain, epigastric   . ADHD (attention deficit hyperactivity disorder)    ADHD  . Anxiety and depression 01/27/2012  . Asthma   . Asthma, mild intermittent   . Bipolar and related disorder (HCC) 01/27/2012  . Eczema 02/19/2012  . Hematuria 05/26/2012  . Herpes labialis 08/18/2011  . Otitis media of left ear 02/19/2012  . Pharyngitis 08/18/2011  . Pharyngitis 05/26/2012  . Preventative health care 08/19/2011  . Reflux   . Sinusitis 05/26/2012    BP 98/62 (BP Location: Left Arm, Patient Position: Sitting, Cuff Size: Normal)   Pulse 92   Temp 98.6 F (37 C) (Oral)   Ht 5\' 4"  (1.626 m)   Wt 116 lb (52.6 kg)   SpO2 98%   BMI 19.91 kg/m  General: Awake, alert, appears stated age HEENT: AT, [place], ears patent b/l and TM neg on L, mildly retracted on R, nares patent w/o discharge, pharynx w mild erythema and without exudates, MMM Neck: No masses or asymmetry Heart: RRR Lungs: Faint wheezes at bases, no accessory muscle use Psych: Age appropriate judgment and insight, normal mood and affect  Sore throat - Plan: POCT rapid strep A  Orders as above. Ck culture. Steroid shot to help with ear, breathing and symptoms. Ibuprofen, Tylenol, other supportive care. Continue to push fluids, practice good hand hygiene, cover mouth when coughing. F/u prn. If starting to experience fevers, shaking, or shortness of breath, seek immediate care. Pt voiced understanding and agreement to the plan.  Jilda Roche  [place], DO 03/22/18 8:56 AM

## 2018-03-22 NOTE — Progress Notes (Signed)
Pre visit review using our clinic review tool, if applicable. No additional management support is needed unless otherwise documented below in the visit note. 

## 2018-03-22 NOTE — Patient Instructions (Addendum)
Ibuprofen 400-600 mg (2-3 over the counter strength tabs) every 6 hours as needed for pain.  OK to take Tylenol 1000 mg (2 extra strength tabs) or 975 mg (3 regular strength tabs) every 6 hours as needed.  Air humidifiers, salt water gargles and throat lozenges can be helpful.  Continue to push fluids, practice good hand hygiene, and cover your mouth if you cough.  If you start having fevers, shaking or shortness of breath, seek immediate care.  Let us know if you need anything.

## 2018-03-22 NOTE — Addendum Note (Signed)
Addended by: Scharlene Gloss B on: 03/22/2018 09:07 AM   Modules accepted: Orders

## 2018-03-24 LAB — CULTURE, GROUP A STREP
MICRO NUMBER: 91355121
SPECIMEN QUALITY:: ADEQUATE

## 2018-08-08 ENCOUNTER — Encounter (HOSPITAL_COMMUNITY): Payer: Self-pay

## 2019-01-30 IMAGING — US US OB < 14 WEEKS - US OB TV
1 series · 15 of 28 positions shown · non-contrast
Comparison: None.

CLINICAL DATA: 21-year-old female with cramping and vaginal
bleeding.

EXAM:
OBSTETRIC <14 WK US AND TRANSVAGINAL OB US
TECHNIQUE: Both transabdominal and transvaginal ultrasound examinations were
performed for complete evaluation of the gestation as well as the
maternal uterus, adnexal regions, and pelvic cul-de-sac.
Transvaginal technique was performed to assess early pregnancy.

[Series 1: us ob < 14 weeks - us ob tv · 15 of 50 slices shown]
[im 1/50]
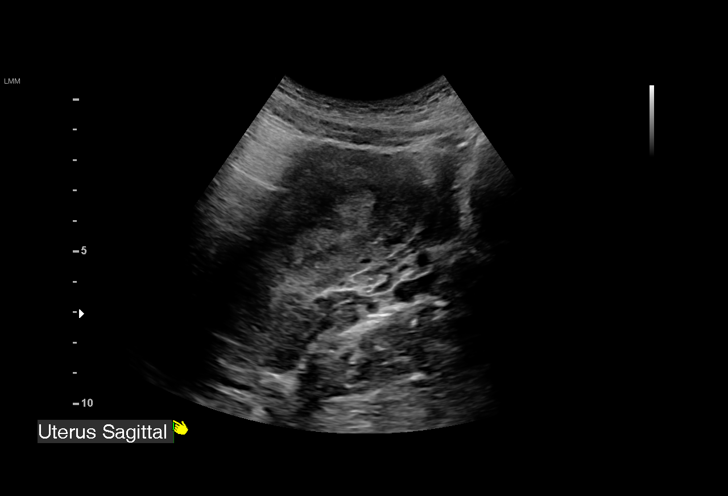
[im 4/50]
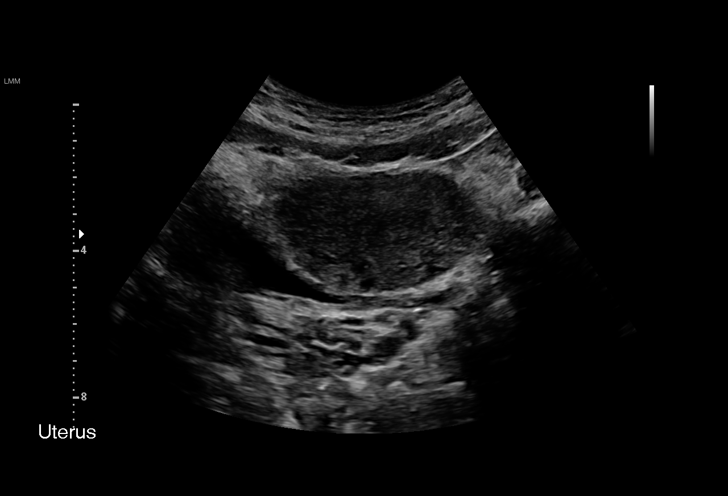
[im 8/50]
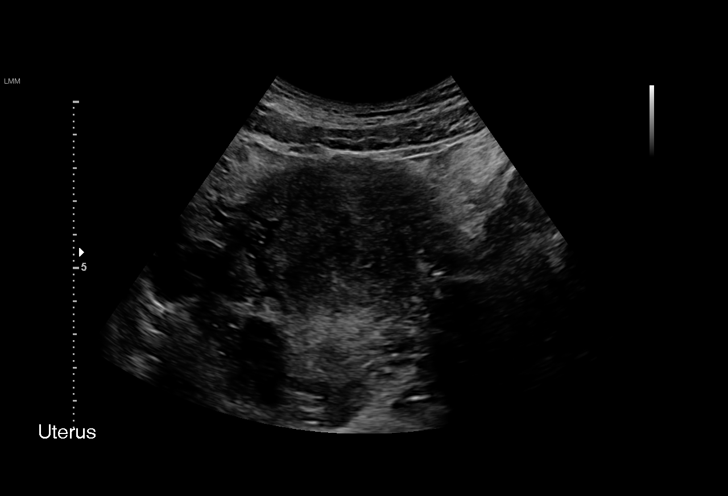
[im 11/50]
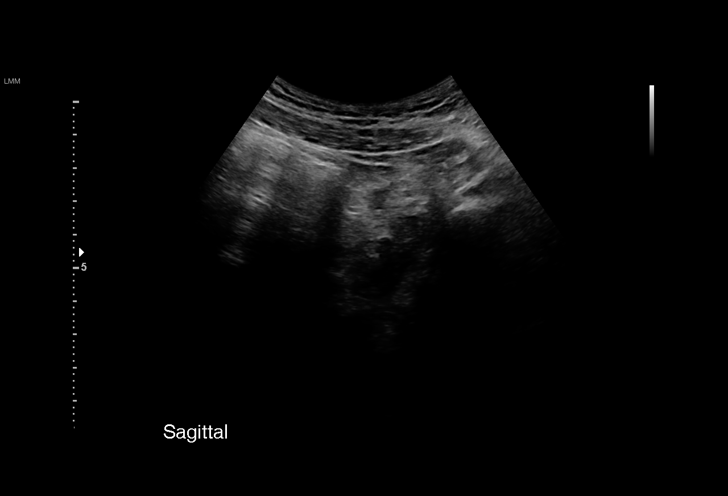
[im 15/50]
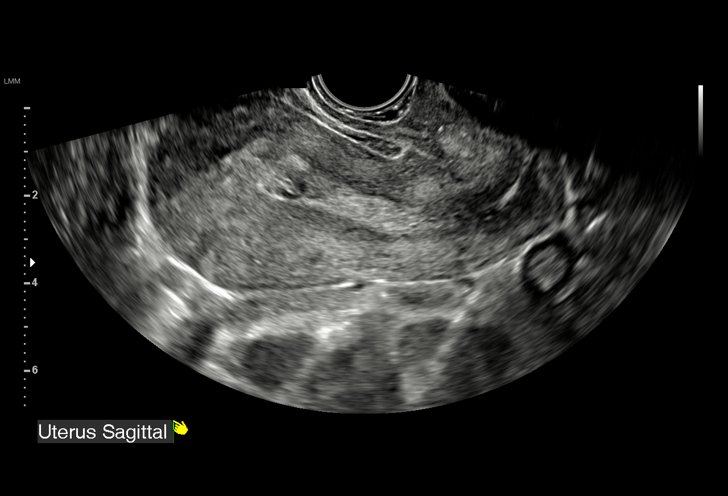
[im 19/50]
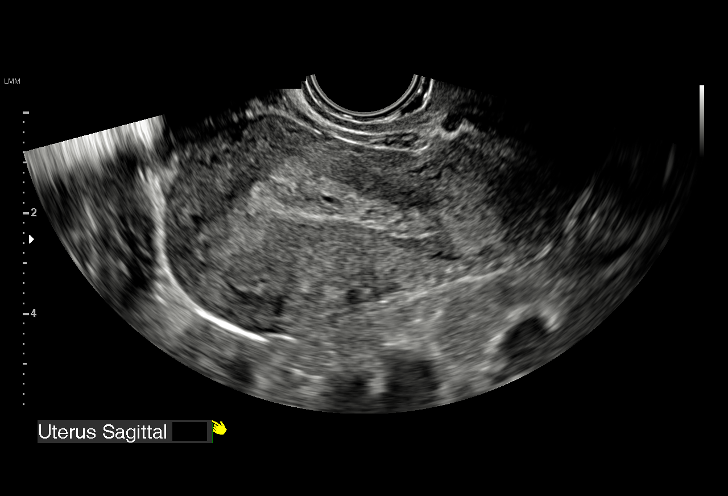
[im 22/50]
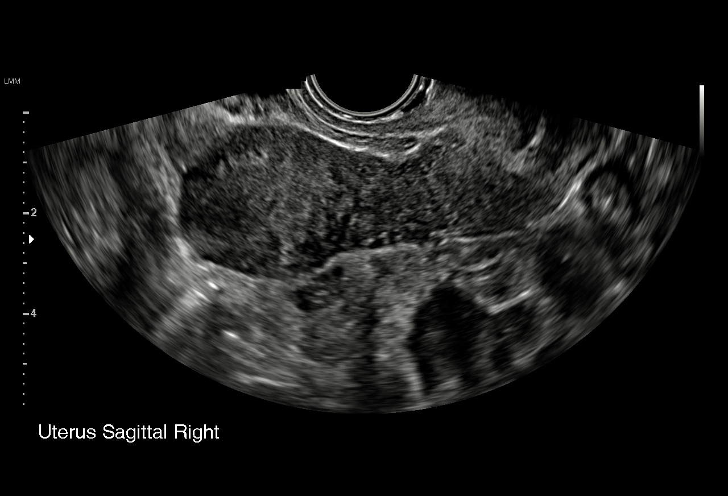
[im 26/50]
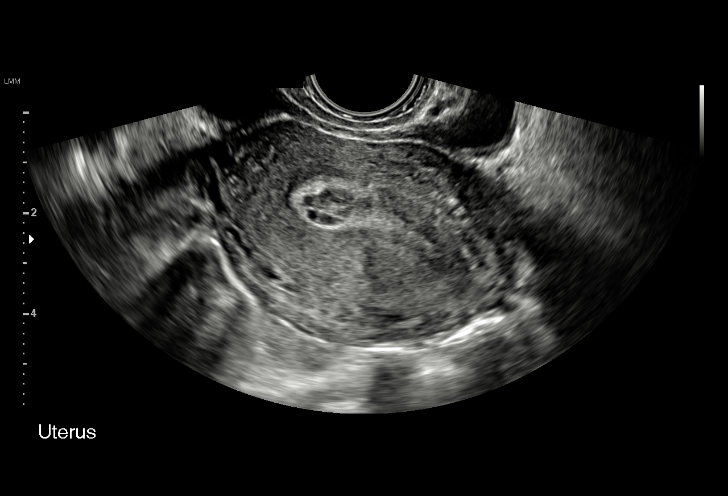
[im 28/50]
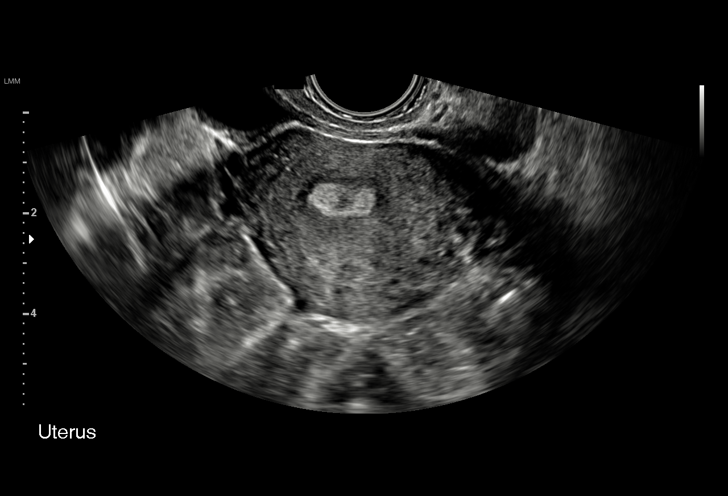
[im 31/50]
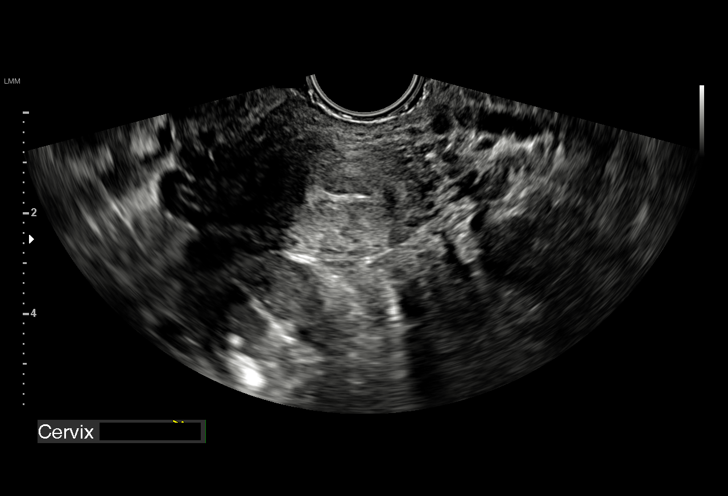
[im 35/50]
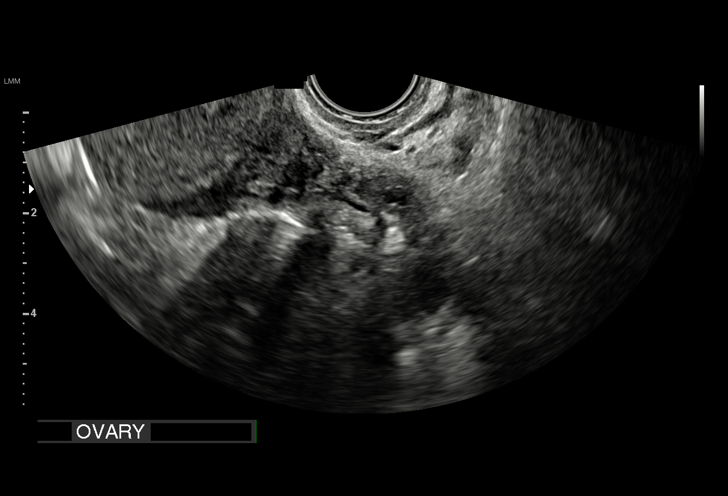
[im 39/50]
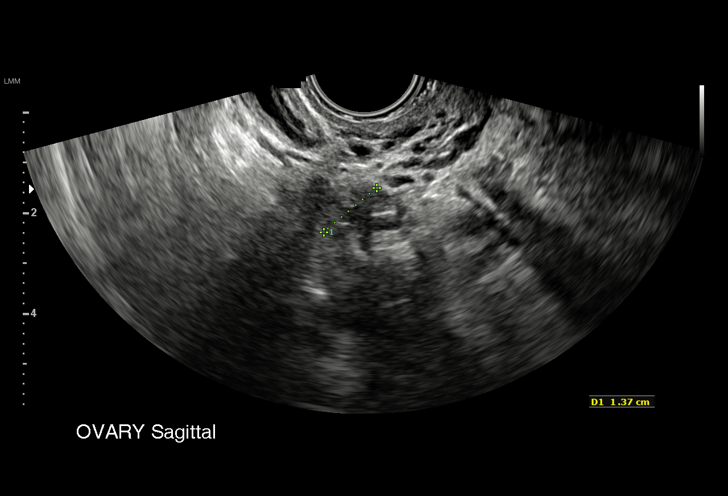
[im 42/50]
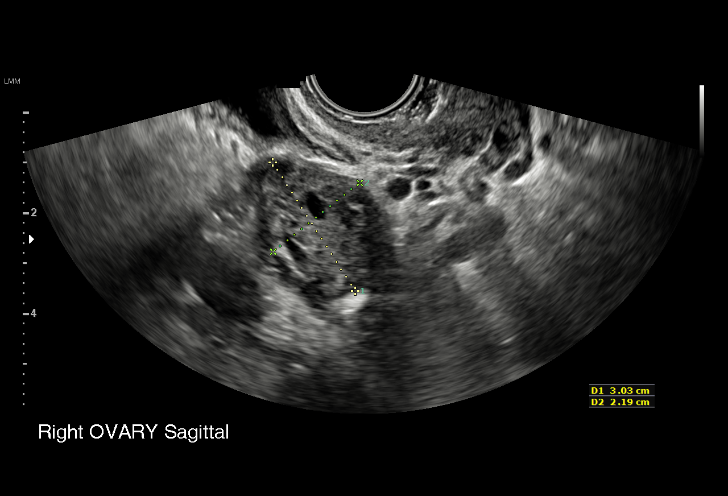
[im 46/50]
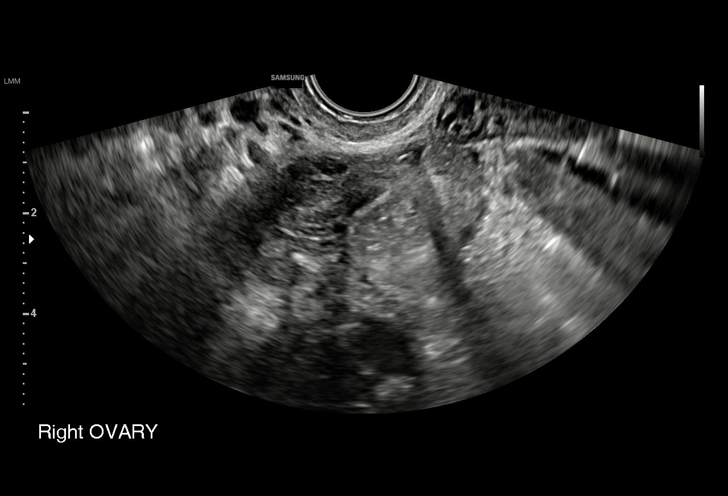
[im 50/50]
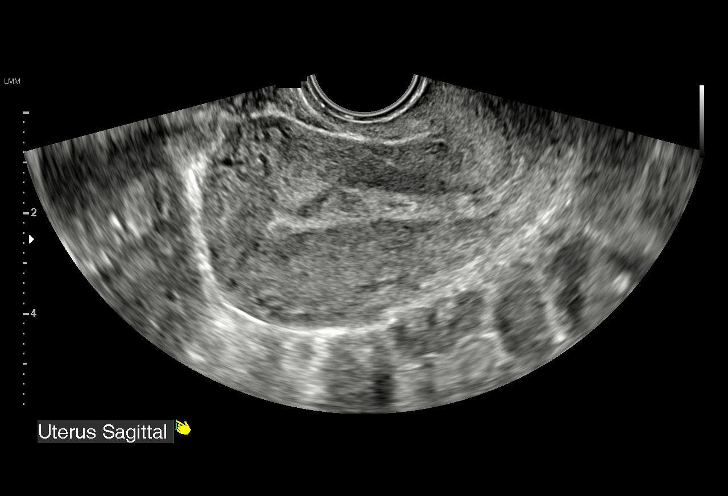

[15 of 28 positions shown; findings below may reference images not displayed]

FINDINGS: The uterus is anteverted and appears unremarkable.

The endometrium is heterogeneous and measures 11 mm in thickness. No
intrauterine pregnancy identified. If the patient has a positive HCG
levels findings consistent with pregnancy of unknown location and
differentials include an early pregnancy, recent spontaneous
miscarriage, or an ectopic pregnancy. Clinical correlation
recommended.

The ovaries appear unremarkable. The right ovary measures 3.0 x
x 2.1 cm and the left ovary measures 1.2 x 0.9 x 1.4 cm.

No free fluid within the pelvis.
IMPRESSION: Heterogeneous and thickened endometrium. No intrauterine pregnancy
identified. Correlation with clinical exam and HCG levels
recommended.

## 2019-02-08 ENCOUNTER — Ambulatory Visit (HOSPITAL_COMMUNITY)
Admission: RE | Admit: 2019-02-08 | Discharge: 2019-02-08 | Disposition: A | Discharge status: Home / Self Care | Payer: 59 | Attending: Psychiatry | Admitting: Psychiatry

## 2019-02-08 ENCOUNTER — Telehealth: Payer: Self-pay | Admitting: Family Medicine

## 2019-02-08 NOTE — Telephone Encounter (Signed)
Ok for pt to establish 

## 2019-02-08 NOTE — Telephone Encounter (Signed)
Patient's mother called and requested an appointment with Dr. Birdie Riddle for anxiety and depression. Patient knows Dr. Birdie Riddle and feels comfortable with her.Advised patient that Dr. Birdie Riddle is out today so she will receive a call back tomorrow.

## 2019-03-07 ENCOUNTER — Ambulatory Visit: Payer: 59 | Admitting: Psychology

## 2019-03-07 NOTE — Telephone Encounter (Signed)
Spoke with Marie Smith pt's mother making her aware of this.

## 2019-03-07 NOTE — Telephone Encounter (Signed)
Unfortunately due to volume, I am unable to accept at this time

## 2019-03-31 ENCOUNTER — Ambulatory Visit (INDEPENDENT_AMBULATORY_CARE_PROVIDER_SITE_OTHER): Payer: 59 | Admitting: Psychology

## 2019-03-31 DIAGNOSIS — F411 Generalized anxiety disorder: Secondary | ICD-10-CM | POA: Diagnosis not present

## 2019-04-26 ENCOUNTER — Ambulatory Visit: Payer: 59 | Admitting: Psychology

## 2019-05-19 ENCOUNTER — Ambulatory Visit (INDEPENDENT_AMBULATORY_CARE_PROVIDER_SITE_OTHER): Payer: 59 | Admitting: Psychology

## 2019-05-19 DIAGNOSIS — F411 Generalized anxiety disorder: Secondary | ICD-10-CM

## 2019-06-07 ENCOUNTER — Ambulatory Visit (INDEPENDENT_AMBULATORY_CARE_PROVIDER_SITE_OTHER): Payer: 59 | Admitting: Psychology

## 2019-06-07 DIAGNOSIS — F411 Generalized anxiety disorder: Secondary | ICD-10-CM | POA: Diagnosis not present

## 2019-06-16 ENCOUNTER — Ambulatory Visit: Payer: 59 | Admitting: Psychology

## 2019-06-23 ENCOUNTER — Ambulatory Visit (INDEPENDENT_AMBULATORY_CARE_PROVIDER_SITE_OTHER): Payer: 59 | Admitting: Psychology

## 2019-06-23 DIAGNOSIS — F411 Generalized anxiety disorder: Secondary | ICD-10-CM

## 2019-06-30 ENCOUNTER — Ambulatory Visit: Payer: 59 | Admitting: Psychology

## 2019-07-07 ENCOUNTER — Ambulatory Visit: Payer: 59 | Admitting: Psychology

## 2019-07-15 ENCOUNTER — Ambulatory Visit: Payer: 59 | Admitting: Psychology

## 2019-08-02 ENCOUNTER — Ambulatory Visit (INDEPENDENT_AMBULATORY_CARE_PROVIDER_SITE_OTHER): Payer: 59 | Admitting: Psychology

## 2019-08-02 DIAGNOSIS — F411 Generalized anxiety disorder: Secondary | ICD-10-CM

## 2019-08-08 ENCOUNTER — Ambulatory Visit: Payer: 59 | Admitting: Psychology

## 2020-01-17 ENCOUNTER — Encounter (HOSPITAL_BASED_OUTPATIENT_CLINIC_OR_DEPARTMENT_OTHER): Payer: Self-pay | Admitting: *Deleted

## 2020-01-17 ENCOUNTER — Other Ambulatory Visit: Payer: Self-pay

## 2020-01-17 ENCOUNTER — Emergency Department (HOSPITAL_BASED_OUTPATIENT_CLINIC_OR_DEPARTMENT_OTHER)
Admission: EM | Admit: 2020-01-17 | Discharge: 2020-01-17 | Disposition: A | Discharge status: Home / Self Care | Payer: 59 | Attending: Emergency Medicine | Admitting: Emergency Medicine

## 2020-01-17 DIAGNOSIS — S0000XA Unspecified superficial injury of scalp, initial encounter: Secondary | ICD-10-CM | POA: Diagnosis present

## 2020-01-17 DIAGNOSIS — Z5321 Procedure and treatment not carried out due to patient leaving prior to being seen by health care provider: Secondary | ICD-10-CM | POA: Insufficient documentation

## 2020-01-17 DIAGNOSIS — W228XXA Striking against or struck by other objects, initial encounter: Secondary | ICD-10-CM | POA: Insufficient documentation

## 2020-01-17 DIAGNOSIS — Y939 Activity, unspecified: Secondary | ICD-10-CM | POA: Diagnosis not present

## 2020-01-17 DIAGNOSIS — Y998 Other external cause status: Secondary | ICD-10-CM | POA: Insufficient documentation

## 2020-01-17 DIAGNOSIS — Y9289 Other specified places as the place of occurrence of the external cause: Secondary | ICD-10-CM | POA: Insufficient documentation

## 2020-01-17 NOTE — ED Notes (Signed)
Called for room X3 no answer

## 2020-01-17 NOTE — ED Triage Notes (Signed)
Yesterday am she leaned over her bed and fell on the top of her head. No laceration noted. Small amount of dried blood on her scalp. No loc. Nausea. She drove herself here.

## 2020-06-08 ENCOUNTER — Inpatient Hospital Stay (HOSPITAL_COMMUNITY): Admission: RE | Admit: 2020-06-08 | Payer: 59 | Source: Home / Self Care | Admitting: Family Medicine

## 2020-06-08 ENCOUNTER — Ambulatory Visit (HOSPITAL_COMMUNITY)
Admission: RE | Admit: 2020-06-08 | Discharge: 2020-06-09 | Disposition: A | Discharge status: Home / Self Care | Payer: No Payment, Other | Attending: Student | Admitting: Student

## 2020-06-08 ENCOUNTER — Encounter (HOSPITAL_COMMUNITY): Payer: Self-pay | Admitting: Emergency Medicine

## 2020-06-08 ENCOUNTER — Other Ambulatory Visit: Payer: Self-pay

## 2020-06-08 DIAGNOSIS — F319 Bipolar disorder, unspecified: Secondary | ICD-10-CM | POA: Insufficient documentation

## 2020-06-08 DIAGNOSIS — F332 Major depressive disorder, recurrent severe without psychotic features: Secondary | ICD-10-CM | POA: Insufficient documentation

## 2020-06-08 DIAGNOSIS — F1020 Alcohol dependence, uncomplicated: Secondary | ICD-10-CM | POA: Insufficient documentation

## 2020-06-08 DIAGNOSIS — Z20822 Contact with and (suspected) exposure to covid-19: Secondary | ICD-10-CM | POA: Insufficient documentation

## 2020-06-08 DIAGNOSIS — F909 Attention-deficit hyperactivity disorder, unspecified type: Secondary | ICD-10-CM | POA: Insufficient documentation

## 2020-06-08 DIAGNOSIS — F17210 Nicotine dependence, cigarettes, uncomplicated: Secondary | ICD-10-CM | POA: Insufficient documentation

## 2020-06-08 NOTE — ED Triage Notes (Signed)
Presents with suicidal thoughts increasing in severity x 2 mos.  Previous attempts noted, wrapped seatbelt around her neck.  Denies HI or AVH.

## 2020-06-08 NOTE — ED Notes (Signed)
Patient belongings are stored in locker #31 

## 2020-06-09 DIAGNOSIS — F332 Major depressive disorder, recurrent severe without psychotic features: Secondary | ICD-10-CM | POA: Diagnosis present

## 2020-06-09 LAB — ETHANOL: Alcohol, Ethyl (B): 110 mg/dL — ABNORMAL HIGH (ref <10)

## 2020-06-09 LAB — COMPREHENSIVE METABOLIC PANEL
ALT: 18 U/L (ref 0–44)
AST: 20 U/L (ref 15–41)
Albumin: 4.6 g/dL (ref 3.5–5.0)
Alkaline Phosphatase: 71 U/L (ref 38–126)
Anion gap: 13 (ref 5–15)
BUN: 7 mg/dL (ref 6–20)
CO2: 24 mmol/L (ref 22–32)
Calcium: 9.9 mg/dL (ref 8.9–10.3)
Chloride: 102 mmol/L (ref 98–111)
Creatinine, Ser: 0.93 mg/dL (ref 0.44–1.00)
GFR, Estimated: >60 mL/min (ref >60)
Glucose, Bld: 73 mg/dL (ref 70–99)
Potassium: 4.5 mmol/L (ref 3.5–5.1)
Sodium: 139 mmol/L (ref 135–145)
Total Bilirubin: 0.5 mg/dL (ref 0.3–1.2)
Total Protein: 7.4 g/dL (ref 6.5–8.1)

## 2020-06-09 LAB — CBC WITH DIFFERENTIAL/PLATELET
Abs Immature Granulocytes: 0.03 10*3/uL (ref 0.00–0.07)
Basophils Absolute: 0.1 10*3/uL (ref 0.0–0.1)
Basophils Relative: 1 %
Eosinophils Absolute: 0.9 10*3/uL — ABNORMAL HIGH (ref 0.0–0.5)
Eosinophils Relative: 11 %
HCT: 48.6 % — ABNORMAL HIGH (ref 36.0–46.0)
Hemoglobin: 16.3 g/dL — ABNORMAL HIGH (ref 12.0–15.0)
Immature Granulocytes: 0 %
Lymphocytes Relative: 37 %
Lymphs Abs: 3 10*3/uL (ref 0.7–4.0)
MCH: 30.1 pg (ref 26.0–34.0)
MCHC: 33.5 g/dL (ref 30.0–36.0)
MCV: 89.7 fL (ref 80.0–100.0)
Monocytes Absolute: 0.6 10*3/uL (ref 0.1–1.0)
Monocytes Relative: 7 %
Neutro Abs: 3.6 10*3/uL (ref 1.7–7.7)
Neutrophils Relative %: 44 %
Platelets: 321 10*3/uL (ref 150–400)
RBC: 5.42 MIL/uL — ABNORMAL HIGH (ref 3.87–5.11)
RDW: 12.6 % (ref 11.5–15.5)
WBC: 8.2 10*3/uL (ref 4.0–10.5)
nRBC: 0 % (ref 0.0–0.2)

## 2020-06-09 LAB — RESP PANEL BY RT-PCR (FLU A&B, COVID) ARPGX2
Influenza A by PCR: NEGATIVE
Influenza B by PCR: NEGATIVE
SARS Coronavirus 2 by RT PCR: NEGATIVE

## 2020-06-09 LAB — POCT URINE DRUG SCREEN - MANUAL ENTRY (I-SCREEN)
POC Amphetamine UR: NOT DETECTED
POC Buprenorphine (BUP): NOT DETECTED
POC Cocaine UR: NOT DETECTED
POC Marijuana UR: NOT DETECTED
POC Methadone UR: NOT DETECTED
POC Methamphetamine UR: NOT DETECTED
POC Morphine: NOT DETECTED
POC Oxazepam (BZO): NOT DETECTED
POC Oxycodone UR: NOT DETECTED
POC Secobarbital (BAR): NOT DETECTED

## 2020-06-09 LAB — POCT PREGNANCY, URINE: Preg Test, Ur: NEGATIVE

## 2020-06-09 LAB — POC SARS CORONAVIRUS 2 AG: SARS Coronavirus 2 Ag: NEGATIVE

## 2020-06-09 LAB — POC SARS CORONAVIRUS 2 AG -  ED: SARS Coronavirus 2 Ag: NEGATIVE

## 2020-06-09 MED ORDER — ALBUTEROL SULFATE HFA 108 (90 BASE) MCG/ACT IN AERS: 2.0000 | INHALATION_SPRAY | Freq: Four times a day (QID) | RESPIRATORY_TRACT | Status: DC | PRN

## 2020-06-09 MED ORDER — LORAZEPAM 1 MG PO TABS: 1.0000 mg | ORAL_TABLET | Freq: Four times a day (QID) | ORAL | Status: DC | PRN

## 2020-06-09 MED ORDER — HYDROXYZINE HCL 25 MG PO TABS: 25.0000 mg | ORAL_TABLET | Freq: Three times a day (TID) | ORAL | Status: DC | PRN

## 2020-06-09 MED ORDER — THIAMINE HCL 100 MG PO TABS: 100.0000 mg | ORAL_TABLET | Freq: Every day | ORAL | Status: DC

## 2020-06-09 MED ORDER — ADULT MULTIVITAMIN W/MINERALS CH
1.0000 | ORAL_TABLET | Freq: Every day | ORAL | Status: DC
  Administered 2020-06-09: 1 via ORAL
  Filled 2020-06-09: qty 1

## 2020-06-09 MED ORDER — TRAZODONE HCL 50 MG PO TABS: 50.0000 mg | ORAL_TABLET | Freq: Every evening | ORAL | 0 refills | Status: DC | PRN

## 2020-06-09 MED ORDER — LOPERAMIDE HCL 2 MG PO CAPS: 2.0000 mg | ORAL_CAPSULE | ORAL | Status: DC | PRN

## 2020-06-09 MED ORDER — FLUOXETINE HCL 20 MG PO CAPS: 20.0000 mg | ORAL_CAPSULE | Freq: Every day | ORAL | 0 refills | Status: DC

## 2020-06-09 MED ORDER — ALBUTEROL SULFATE (2.5 MG/3ML) 0.083% IN NEBU: 2.5000 mg | INHALATION_SOLUTION | Freq: Four times a day (QID) | RESPIRATORY_TRACT | Status: DC | PRN

## 2020-06-09 MED ORDER — PANTOPRAZOLE SODIUM 40 MG PO TBEC
40.0000 mg | DELAYED_RELEASE_TABLET | Freq: Every day | ORAL | Status: DC
  Administered 2020-06-09: 40 mg via ORAL
  Filled 2020-06-09: qty 1

## 2020-06-09 MED ORDER — ACETAMINOPHEN 325 MG PO TABS: 650.0000 mg | ORAL_TABLET | Freq: Four times a day (QID) | ORAL | Status: DC | PRN

## 2020-06-09 MED ORDER — ONDANSETRON 4 MG PO TBDP: 4.0000 mg | ORAL_TABLET | Freq: Four times a day (QID) | ORAL | Status: DC | PRN

## 2020-06-09 MED ORDER — MAGNESIUM HYDROXIDE 400 MG/5ML PO SUSP: 30.0000 mL | Freq: Every day | ORAL | Status: DC | PRN

## 2020-06-09 MED ORDER — TRAZODONE HCL 50 MG PO TABS: 50.0000 mg | ORAL_TABLET | Freq: Every evening | ORAL | Status: DC | PRN

## 2020-06-09 MED ORDER — ALUM & MAG HYDROXIDE-SIMETH 200-200-20 MG/5ML PO SUSP: 30.0000 mL | ORAL | Status: DC | PRN

## 2020-06-09 MED ORDER — TRAZODONE HCL 50 MG PO TABS
50.0000 mg | ORAL_TABLET | Freq: Every evening | ORAL | 0 refills | Status: DC | PRN
Start: 2020-06-09 — End: 2020-06-09

## 2020-06-09 MED ORDER — LORATADINE 10 MG PO TABS
10.0000 mg | ORAL_TABLET | Freq: Every day | ORAL | Status: DC
  Administered 2020-06-09: 10 mg via ORAL
  Filled 2020-06-09: qty 1

## 2020-06-09 MED ORDER — FLUOXETINE HCL 20 MG PO CAPS: 20.0000 mg | ORAL_CAPSULE | Freq: Every day | ORAL | Status: DC

## 2020-06-09 MED ORDER — HYDROXYZINE HCL 25 MG PO TABS: 25.0000 mg | ORAL_TABLET | Freq: Three times a day (TID) | ORAL | 0 refills | Status: DC | PRN
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
## 2020-06-09 NOTE — Discharge Instructions (Signed)
Take all medications as prescribed. Keep all follow-up appointments as scheduled.  Do not consume alcohol or use illegal drugs while on prescription medications. Report any adverse effects from your medications to your primary care provider promptly.  In the event of recurrent symptoms or worsening symptoms, call 911, a crisis hotline, or go to the nearest emergency department for evaluation.   

## 2020-06-09 NOTE — ED Provider Notes (Signed)
FBC/OBS ASAP Discharge Summary  Date and Time: 06/09/2020 11:53 AM  Name: Marie Smith  MRN:  [id number]   Discharge Diagnoses:  Final diagnoses:  Severe episode of recurrent major depressive disorder, without psychotic features (HCC)  Alcohol use disorder, severe, dependence (HCC)    Evaluation:Marie Smith was seen via face-to-face.  She presents with a bright and pleasant affect. Marie Smith is denying suicidal or homicidal ideations during this assement patient was restarted on Prozac and Trazodone for mood stabilization. Denies auditory or visual hallucinations.  Patient provided verbal authorization to follow-up with her father Marie Smith. Who reported he would like to speak to patient's mother.  He denied any safety concerns with guns or weapons in the home. Discussed partial hospitalization programming- [facility].  Father reported " I am unsure if she has been taken her medications as directed." NP will make prescriptions available. Patient and family was receptive to plan.  Denies patient has access to guns or weapons in the home.  HPI: per admission assessment note:Pt is a 25 year old single female who presents to [facility] accompanied by her mother, father and cousin. Pt requested her cousin, Marie Smith, participate in assessment. Pt states she came to [facility] tonight "because I want to kill myself." Pt says she told her mother she was going to kill herself and mother convinced her to come for assessment.  Total Time spent with patient: 15 minutes  Past Psychiatric History:  Past Medical History:  Past Medical History:  Diagnosis Date  . Abdominal pain, epigastric   . ADHD (attention deficit hyperactivity disorder)    ADHD  . Anxiety and depression 01/27/2012  . Asthma   . Asthma, mild intermittent   . Bipolar and related disorder (HCC) 01/27/2012  . Eczema 02/19/2012  . Hematuria 05/26/2012  . Herpes labialis 08/18/2011  . Otitis media of left ear 02/19/2012  . Pharyngitis 08/18/2011   . Pharyngitis 05/26/2012  . Preventative health care 08/19/2011  . Reflux   . Sinusitis 05/26/2012   History reviewed. No pertinent surgical history. Family History:  Family History  Problem Relation Age of Onset  . Cancer Paternal Grandmother   . Emphysema Paternal Grandmother        smoker  . COPD Paternal Grandmother   . Stroke Paternal Grandmother   . Heart attack Paternal Grandmother   . Mental illness Paternal Grandmother        Bipolar   Family Psychiatric History:  Social History:  Social History   Substance and Sexual Activity  Alcohol Use Yes   Comment: occassional     Social History   Substance and Sexual Activity  Drug Use No    Social History   Socioeconomic History  . Marital status: Significant Other    Spouse name: Not on file  . Number of children: Not on file  . Years of education: Not on file  . Highest education level: Not on file  Occupational History  . Not on file  Tobacco Use  . Smoking status: Current Some Day Smoker    Packs/day: 0.50    Types: Cigarettes  . Smokeless tobacco: Never Used  . Tobacco comment: socially  Vaping Use  . Vaping Use: Some days  Substance and Sexual Activity  . Alcohol use: Yes    Comment: occassional  . Drug use: No  . Sexual activity: Yes    Birth control/protection: Injection  Other Topics Concern  . Not on file  Social History Narrative   11th grade  Social Determinants of Health   Financial Resource Strain: Not on file  Food Insecurity: Not on file  Transportation Needs: Not on file  Physical Activity: Not on file  Stress: Not on file  Social Connections: Not on file   SDOH:  SDOH Screenings   Alcohol Screen: Not on file  Depression (PHQ2-9): Medium Risk  . PHQ-2 Score: 25  Financial Resource Strain: Not on file  Food Insecurity: Not on file  Housing: Not on file  Physical Activity: Not on file  Social Connections: Not on file  Stress: Not on file  Tobacco Use: High Risk  . Smoking  Tobacco Use: Current Some Day Smoker  . Smokeless Tobacco Use: Never Used  Transportation Needs: Not on file    Has this patient used any form of tobacco in the last 30 days? (Cigarettes, Smokeless Tobacco, Cigars, and/or Pipes) Prescription not provided because: none tabacco user  Current Medications:  Current Facility-Administered Medications  Medication Dose Route Frequency Provider Last Rate Last Admin  . acetaminophen (TYLENOL) tablet 650 mg  650 mg Oral Q6H PRN Melbourne Abts W, PA-C      . albuterol (PROVENTIL) (2.5 MG/3ML) 0.083% nebulizer solution 2.5 mg  2.5 mg Nebulization Q6H PRN Melbourne Abts W, PA-C      . albuterol (VENTOLIN HFA) 108 (90 Base) MCG/ACT inhaler 2 puff  2 puff Inhalation Q6H PRN Jaclyn Shaggy, PA-C      . alum & mag hydroxide-simeth (MAALOX/MYLANTA) 200-200-20 MG/5ML suspension 30 mL  30 mL Oral Q4H PRN Jaclyn Shaggy, PA-C      . FLUoxetine (PROZAC) capsule 20 mg  20 mg Oral Daily Leeanne, Butters W, PA-C      . hydrOXYzine (ATARAX/VISTARIL) tablet 25 mg  25 mg Oral TID PRN Jaclyn Shaggy, PA-C      . loperamide (IMODIUM) capsule 2-4 mg  2-4 mg Oral PRN Melbourne Abts W, PA-C      . loratadine (CLARITIN) tablet 10 mg  10 mg Oral Daily Raylan, Troiani W, PA-C   10 mg at 06/09/20 0942  . LORazepam (ATIVAN) tablet 1 mg  1 mg Oral Q6H PRN Melbourne Abts W, PA-C      . magnesium hydroxide (MILK OF MAGNESIA) suspension 30 mL  30 mL Oral Daily PRN Melbourne Abts W, PA-C      . multivitamin with minerals tablet 1 tablet  1 tablet Oral Daily Khaniyah, Bezek, PA-C   1 tablet at 06/09/20 [phone]  . ondansetron (ZOFRAN-ODT) disintegrating tablet 4 mg  4 mg Oral Q6H PRN Jaclyn Shaggy, PA-C      . pantoprazole (PROTONIX) EC tablet 40 mg  40 mg Oral Daily Monifa, Blanchette W, PA-C   40 mg at 06/09/20 0941  . [START ON 06/10/2020] thiamine tablet 100 mg  100 mg Oral Daily Soha, Thorup W, PA-C      . traZODone (DESYREL) tablet 50 mg  50 mg Oral QHS PRN Jaclyn Shaggy, PA-C       Current Outpatient  Medications  Medication Sig Dispense Refill  . albuterol (PROVENTIL HFA;VENTOLIN HFA) 108 (90 BASE) MCG/ACT inhaler Inhale 2 puffs into the lungs every 6 (six) hours as needed for wheezing or shortness of breath. 1 Inhaler 5  . albuterol (PROVENTIL) (2.5 MG/3ML) 0.083% nebulizer solution Take 3 mLs (2.5 mg total) by nebulization every 6 (six) hours as needed for wheezing or shortness of breath. 150 mL 1  . beclomethasone (QVAR) 80 MCG/ACT inhaler Inhale 1 puff into the  lungs 2 (two) times daily. 1 Inhaler 3  . cetirizine (ZYRTEC) 10 MG tablet Take 10 mg by mouth daily.    Marland Kitchen FLUoxetine (PROZAC) 20 MG capsule Take 1 capsule (20 mg total) by mouth daily. 30 capsule 0  . hydrOXYzine (ATARAX/VISTARIL) 25 MG tablet Take 1 tablet (25 mg total) by mouth 3 (three) times daily as needed for anxiety. 30 tablet 0  . omeprazole (PRILOSEC) 20 MG capsule Take 20 mg by mouth daily.    . ranitidine (ZANTAC) 150 MG tablet Take 1 tablet (150 mg total) by mouth at bedtime. qhs prn 30 tablet 5  . traZODone (DESYREL) 50 MG tablet Take 1 tablet (50 mg total) by mouth at bedtime as needed for sleep. 30 tablet 0    PTA Medications: (Not in a hospital admission)   Musculoskeletal  Strength & Muscle Tone: within normal limits Gait & Station: normal Patient leans: N/A  Psychiatric Specialty Exam  Presentation  General Appearance: Appropriate for Environment; Well Groomed  Eye Contact:Good  Speech:Clear and Coherent; Normal Rate  Speech Volume:Normal  Handedness:No data recorded  Mood and Affect  Mood:Depressed  Affect:Congruent   Thought Process  Thought Processes:Coherent; Goal Directed; Linear  Descriptions of Associations:Intact  Orientation:Full (Time, Place and Person)  Thought Content:WDL  Hallucinations:Hallucinations: None  Ideas of Reference:None  Suicidal Thoughts:Suicidal Thoughts: -- (Patient denies SI currently, but states that she last experienced SI on the afternoon of 06/08/20,  in which she told her mother that she "would rather be dead than live this life I'm living". Patient denies plan associated with SI.)  Homicidal Thoughts:Homicidal Thoughts: No   Sensorium  Memory:Immediate Good; Recent Good; Remote Good  Judgment:Fair  Insight:Fair   Executive Functions  Concentration:Fair  Attention Span:Fair  Recall:Fair  Fund of Knowledge:Fair  Language:Fair   Psychomotor Activity  Psychomotor Activity:Psychomotor Activity: Normal   Assets  Assets:Communication Skills; Desire for Improvement; Financial Resources/Insurance; Housing; Leisure Time; Physical Health; Resilience; Social Support; Transportation   Sleep  Sleep:Sleep: Poor Number of Hours of Sleep: 2   Physical Exam  Physical Exam Vitals reviewed.  Neurological:     Mental Status: She is alert.  Psychiatric:        Mood and Affect: Mood normal.        Behavior: Behavior normal.    Review of Systems  Psychiatric/Behavioral: Positive for depression. Negative for suicidal ideas. The patient is nervous/anxious. The patient does not have insomnia.   All other systems reviewed and are negative.  Blood pressure 112/78, pulse 90, temperature 98 F (36.7 C), temperature source Temporal, resp. rate 18, height 5\' 4"  (1.626 m), weight 105 lb 9.6 oz (47.9 kg), SpO2 99 %, unknown if currently breastfeeding. Body mass index is 18.13 kg/m.  Demographic Factors:  Caucasian  Loss Factors: Financial problems/change in socioeconomic status  Historical Factors: Prior suicide attempts  Risk Reduction Factors:   Living with another person, especially a relative and Positive social support  Continued Clinical Symptoms:  Depression:   Impulsivity  Cognitive Features That Contribute To Risk:  Closed-mindedness    Suicide Risk:  Minimal: No identifiable suicidal ideation.  Patients presenting with no risk factors but with morbid ruminations; may be classified as minimal risk based on the  severity of the depressive symptoms  Plan Of Care/Follow-up recommendations:  Activity:  as tolorated Diet:  heart healthy  -Prescription provided for Prozac 20 mg and trazodone 50 mg -Patient to follow-up with partial hospitalization programming at [facility] behavioral health  Disposition: Take all  medications as prescribed. Keep all follow-up appointments as scheduled.  Do not consume alcohol or use illegal drugs while on prescription medications. Report any adverse effects from your medications to your primary care provider promptly.  In the event of recurrent symptoms or worsening symptoms, call 911, a crisis hotline, or go to the nearest emergency department for evaluation.   Oneta Rack, NP 06/09/2020, 11:53 AM

## 2020-06-09 NOTE — ED Notes (Signed)
Given lunch

## 2020-06-09 NOTE — ED Notes (Addendum)
Pt resting with eyes closed. Rise and fall of chest noted. No new issues noted. Will continue to monitor for safety 

## 2020-06-09 NOTE — ED Notes (Signed)
Pt sleeping@this time. breathing even and unlabored. Will continue to monitor for safety 
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
## 2020-06-09 NOTE — ED Notes (Signed)
Educated pt on avs and medications. Verbalized understanding. Escorted to retrieve belongings. Ambulated per self. No s/s pain, discomfort, or acute distress. No SI, HI, or AVH. Escorted to front lobby. Family present to pick up. Stable at time of d/c

## 2020-06-12 ENCOUNTER — Telehealth (HOSPITAL_COMMUNITY): Payer: Self-pay | Admitting: Licensed Clinical Social Worker

## 2020-06-29 ENCOUNTER — Other Ambulatory Visit (HOSPITAL_COMMUNITY): Payer: Self-pay | Admitting: Family

## 2022-08-25 DIAGNOSIS — J0100 Acute maxillary sinusitis, unspecified: Secondary | ICD-10-CM | POA: Diagnosis not present

## 2022-11-19 DIAGNOSIS — Z20822 Contact with and (suspected) exposure to covid-19: Secondary | ICD-10-CM | POA: Diagnosis not present

## 2022-11-19 DIAGNOSIS — J209 Acute bronchitis, unspecified: Secondary | ICD-10-CM | POA: Diagnosis not present

## 2022-11-19 DIAGNOSIS — J0100 Acute maxillary sinusitis, unspecified: Secondary | ICD-10-CM | POA: Diagnosis not present

## 2023-05-13 NOTE — L&D Delivery Note (Signed)
 Operative Delivery Note At 6:20 PM a viable female was delivered via Vaginal, Spontaneous.  Presentation: vertex; Position: Right,, Occiput,, Anterior; Station: +2.  Verbal consent: obtained from patient.  Risks and benefits discussed in detail.  Risks include, but are not limited to the risks of anesthesia, bleeding, infection, damage to maternal tissues, fetal cephalhematoma.  There is also the risk of inability to effect vaginal delivery of the head, or shoulder dystocia that cannot be resolved by established maneuvers, leading to the need for emergency cesarean section.  APGAR: 8, 9; weight  .   Placenta status:spontaneously with 3 vessel cord    Cord:  with the following complications: tight nuchal cord x 1 .  Cord pH: not obtained  Anesthesia:  epidural Instruments: mushroom with 1 pull and no popoff Episiotomy:  none Lacerations:  2nd Suture Repair: 3.0 chromic Est. Blood Loss (mL):  300  Mom to postpartum.  Baby to Couplet care / Skin to Skin.  Rosaline LITTIE Cobble 12/23/2023, 6:46 PM

## 2023-05-14 DIAGNOSIS — N911 Secondary amenorrhea: Secondary | ICD-10-CM | POA: Diagnosis not present

## 2023-05-21 DIAGNOSIS — N911 Secondary amenorrhea: Secondary | ICD-10-CM | POA: Diagnosis not present

## 2023-06-02 DIAGNOSIS — Z3685 Encounter for antenatal screening for Streptococcus B: Secondary | ICD-10-CM | POA: Diagnosis not present

## 2023-06-02 DIAGNOSIS — Z3401 Encounter for supervision of normal first pregnancy, first trimester: Secondary | ICD-10-CM | POA: Diagnosis not present

## 2023-06-02 DIAGNOSIS — Z369 Encounter for antenatal screening, unspecified: Secondary | ICD-10-CM | POA: Diagnosis not present

## 2023-06-02 DIAGNOSIS — Z3A09 9 weeks gestation of pregnancy: Secondary | ICD-10-CM | POA: Diagnosis not present

## 2023-06-02 DIAGNOSIS — Z3481 Encounter for supervision of other normal pregnancy, first trimester: Secondary | ICD-10-CM | POA: Diagnosis not present

## 2023-06-02 LAB — OB RESULTS CONSOLE RUBELLA ANTIBODY, IGM: Rubella: IMMUNE

## 2023-06-02 LAB — HEPATITIS C ANTIBODY: HCV Ab: NEGATIVE

## 2023-06-02 LAB — OB RESULTS CONSOLE RPR: RPR: NONREACTIVE

## 2023-06-02 LAB — OB RESULTS CONSOLE HIV ANTIBODY (ROUTINE TESTING): HIV: NONREACTIVE

## 2023-06-02 LAB — OB RESULTS CONSOLE HEPATITIS B SURFACE ANTIGEN: Hepatitis B Surface Ag: NEGATIVE

## 2023-06-09 DIAGNOSIS — Z3481 Encounter for supervision of other normal pregnancy, first trimester: Secondary | ICD-10-CM | POA: Diagnosis not present

## 2023-06-09 DIAGNOSIS — Z3A10 10 weeks gestation of pregnancy: Secondary | ICD-10-CM | POA: Diagnosis not present

## 2023-06-09 DIAGNOSIS — Z113 Encounter for screening for infections with a predominantly sexual mode of transmission: Secondary | ICD-10-CM | POA: Diagnosis not present

## 2023-06-09 LAB — OB RESULTS CONSOLE GC/CHLAMYDIA
Chlamydia: NEGATIVE
Neisseria Gonorrhea: NEGATIVE

## 2023-08-11 DIAGNOSIS — Z20822 Contact with and (suspected) exposure to covid-19: Secondary | ICD-10-CM | POA: Diagnosis not present

## 2023-08-11 DIAGNOSIS — O26892 Other specified pregnancy related conditions, second trimester: Secondary | ICD-10-CM | POA: Diagnosis not present

## 2023-08-11 DIAGNOSIS — Z3A19 19 weeks gestation of pregnancy: Secondary | ICD-10-CM | POA: Diagnosis not present

## 2023-08-11 DIAGNOSIS — M545 Low back pain, unspecified: Secondary | ICD-10-CM | POA: Diagnosis not present

## 2023-08-11 DIAGNOSIS — O219 Vomiting of pregnancy, unspecified: Secondary | ICD-10-CM | POA: Diagnosis not present

## 2023-08-11 NOTE — ED Provider Notes (Signed)
 Emergency Department Provider Note    ED Clinical Impression   Final diagnoses:  Nausea and vomiting, unspecified vomiting type (Primary)  Hyperemesis gravidarum    ED Assessment/Plan    Condition: Stable Disposition: Discharge  This chart has been completed using Dragon Medical Dictation software, and while attempts have been made to ensure accuracy, certain words and phrases may not be transcribed as intended.   History   Chief Complaint  Patient presents with  . Emesis   HPI  Marie Smith is a 28 y.o. female  who presents today to the  emergency department complaining of nausea and vomiting that started after eating some hot dogs and frozen gas station.  She describes slight lower back discomfort and suprapubic discomfort.  She is G1, P0, [redacted] weeks pregnant.  She denies any vaginal bleeding.  She describes symptoms as moderate.  There are no obvious aggravating or relieving factors.    Allergies: has no known allergies. Medications: has a current medication list which includes the following long-term medication(s): albuterol , albuterol , and fluticasone propionate. PMHx:  has a past medical history of Asthma and GERD (gastroesophageal reflux disease). PSHx:  has no past surgical history on file. SocHx:  reports that she has never smoked. She has never been exposed to tobacco smoke. She has never used smokeless tobacco. She reports current alcohol use. She reports that she does not use drugs. Allergies, Medications, Medical, Surgical, and Social History were reviewed as documented above.   Social Drivers of Health with Concerns   Food Insecurity: Not on file  Transportation Needs: Not on file  Alcohol Use: Not on file  Housing: Not on file  Physical Activity: Not on file  Utilities: Not on file  Stress: Not on file  Interpersonal Safety: Not on file  Substance Use: Not on file  (03/19/2023)  Intimate Partner Violence: Not on file  Social Connections: Not on file  Financial Resource Strain: Not on file  Internet Connectivity: Not on file  Health Literacy: Not on file     Review Of Systems  Review of Systems  Constitutional:  Negative for fever.  HENT:  Negative for congestion.   Respiratory:  Negative for chest tightness and shortness of breath.   Cardiovascular:  Negative for chest pain.  Gastrointestinal:  Positive for nausea and vomiting. Negative for abdominal pain.  Genitourinary:  Positive for pelvic pain.  Musculoskeletal:  Positive for back pain.  Skin:  Negative for color change.  Psychiatric/Behavioral:  Negative for behavioral problems.   All other systems reviewed and are negative.   Physical Exam   BP 100/65   Pulse 100   Temp 36.2 C (97.1 F) (Oral)   Resp 16   Ht 162.6 cm (5' 4)   Wt 59.7 kg (131 lb 9.6 oz)   LMP 07/25/2022 (Exact Date)   SpO2 100%   BMI 22.59 kg/m   Physical Exam Vitals and nursing note reviewed.  Constitutional:      General: She is not in acute distress. HENT:     Head: Normocephalic.  Eyes:     Conjunctiva/sclera: Conjunctivae normal.  Cardiovascular:     Rate and Rhythm: Regular rhythm.     Pulses: Normal pulses.     Heart sounds: Normal heart sounds.  Pulmonary:     Effort: No respiratory  distress.     Breath sounds: Normal breath sounds.  Abdominal:     Tenderness: There is no abdominal tenderness. There is no guarding or rebound.     Hernia: No hernia is present.     Comments: Gravid abdomen.  Abdomen is nontender.  Normal active bowel sounds.  Musculoskeletal:        General: No deformity.  Skin:    General: Skin is warm.     Capillary Refill: Capillary refill takes 2 to 3 seconds.     Comments: Normal cap refill.  Neurological:     General: No focal deficit present.  Psychiatric:        Mood and Affect: Mood normal.     ED Course  Medical Decision Making Differential includes  viral syndrome versus influenza versus COVID-19 infection versus hyperemesis gravidarum versus dehydration versus side effect of the medicine versus UTI.  12:22 PM Patient is doing well.  Nausea resolved.  She is tolerating p.o.  She does not want any prescription for antiemetics.  Labs and imaging reviewed and discussed.  Patient is stable for discharge.  I have reviewed my clinical findings and studies and my clinical impression with the patient. The patient has expressed understanding that at this time there is no evidence for a more malignant underlying process, but the patient also understands that early in the process of a condition such as this, an initial workup can be falsely reassuring. I have counseled the patient and discussed follow-up with the patient, stressing the importance of appropriate follow-up. I have also counseled the patient to return if worse or any concerns. Routine discharge counseling was given to the patient and the patient understands that worsening, changing or persistent symptoms should prompt an immediate call or follow up with their primary physician or return to the emergency department for reevaluation. Patient has expressed understanding.     Problems Addressed: Hyperemesis gravidarum: acute illness or injury that poses a threat to life or bodily functions Nausea and vomiting, unspecified vomiting type: acute illness or injury that poses a threat to life or bodily functions  Amount and/or Complexity of Data Reviewed Labs: ordered. Decision-making details documented in ED Course. Radiology: ordered. Decision-making details documented in ED Course.  Risk Decision regarding hospitalization.     Procedures   No results found for this visit on 08/11/23 (from the past 4464 hours).   ED Results Results for orders placed or performed during the hospital encounter of 08/11/23  Influenza/ RSV/COVID PCR   Specimen: Nasopharyngeal Swab  Result Value Ref Range    SARS-CoV-2 PCR Negative Negative   Influenza A Negative Negative   Influenza B Negative Negative   RSV Negative Negative  Comprehensive Metabolic Panel  Result Value Ref Range   Sodium 138 135 - 145 mmol/L   Potassium 3.8 3.5 - 5.0 mmol/L   Chloride 104 98 - 107 mmol/L   CO2 25.8 21.0 - 32.0 mmol/L   Anion Gap 8 3 - 11 mmol/L   BUN 9 8 - 20 mg/dL   Creatinine 9.39 9.39 - 1.10 mg/dL   BUN/Creatinine Ratio 15    eGFR CKD-EPI (2021) Female >90 >=60 mL/min/1.48m2   Glucose 99 70 - 179 mg/dL   Calcium  8.9 8.5 - 10.1 mg/dL   Albumin 3.4 (L) 3.5 - 5.0 g/dL   Total Protein 7.7 6.0 - 8.0 g/dL   Total Bilirubin 0.7 0.3 - 1.2 mg/dL   AST 17 15 - 40 U/L   ALT 31 12 -  78 U/L   Alkaline Phosphatase 66 46 - 116 U/L  Lipase  Result Value Ref Range   Lipase 20 16 - 77 U/L  Magnesium   Result Value Ref Range   Magnesium  1.8 1.6 - 2.6 mg/dL  CBC w/ Differential  Result Value Ref Range   WBC 10.2 4.0 - 10.5 10*9/L   RBC 4.22 3.80 - 5.10 10*12/L   HGB 12.8 11.5 - 15.0 g/dL   HCT 63.5 65.9 - 55.9 %   MCV 86.3 80.0 - 98.0 fL   MCH 30.3 27.0 - 34.0 pg   MCHC 35.2 32.0 - 36.0 g/dL   RDW 86.8 88.4 - 85.4 %   MPV 9.2 7.4 - 10.4 fL   Platelet 204 140 - 415 10*9/L  Urinalysis with Microscopy with Culture Reflex  Result Value Ref Range   Color, UA Amber    Clarity, UA Clear Clear   Specific Gravity, UA 1.020 1.010 - 1.025   pH, UA 7.0 5.0 - 8.0   Leukocyte Esterase, UA Negative Negative   Nitrite, UA Negative Negative   Protein, UA Trace (A) Negative   Glucose, UA Negative Negative   Ketones, UA >80 mg/dL (A) Negative   Urobilinogen, UA 1.0 mg/dL 0.1 - 1.0 mg/dL   Bilirubin, UA Negative Negative   Blood, UA Negative Negative   RBC, UA 0 0 - 5 /HPF   WBC, UA 1 <=5 /HPF   Squam Epithel, UA 18 (H) 0 - 10 /HPF   Bacteria, UA Few Few, Occasional, Small, None Seen /HPF   Renal Epithel, UA 1 (H) <=0 /HPF  Manual Differential  Result Value Ref Range   Neutrophils % 89 %   Lymphocytes % 5 %    Monocytes % 3 %   Eosinophils % 2 %   Atypical Lymphocytes % 1 %   Absolute Neutrophils 9.1 (H) 1.8 - 7.8 10*9/L   Absolute Lymphocytes 0.6 (L) 0.7 - 4.5 10*9/L   Absolute Monocytes 0.3 0.1 - 1.0 10*9/L   Absolute Eosinophils 0.2 0.0 - 0.4 10*9/L   Absolute Basophils     Smear Review Comments See Comment (A) Undefined   US  OB Limited >= 14 Weeks Targeted (Placenta Or Afi Or Heart Tone Or Position) Result Date: 08/11/2023 Indication ======== Back pain Second trimester pregnancy . Maternal Assessment ================= Height 163 cm, 5 ft 4 in. Weight 60 kg, 132 lb. BMI 22.47 kg/m? Method ====== Transabdominal ultrasound examination. View: Good view. Pregnancy ========= Singleton pregnancy. Number of fetuses: 1 **prev scan @another  facility Dating ====== Prior assessment by: Previous u/s GA by prior assessment 19 w + 1 d EDD by prior assessment: 01/04/2024 Ultrasound examination on: 08/11/2023 GA by U/S based upon: BPD GA by U/S 19 w + 4 d EDD by U/S: 01/01/2024 Assigned: based on stated EDD (Previous u/s), selected on 08/11/2023 Assigned GA (weeks days) 19 w + 1 d Assigned EDD: 01/04/2024 Fetal Growth Overview ================= Exam date        GA              BPD (mm)        HC (mm)         AC (mm)        FL (mm)        HL (mm)         EFW (g) 08/11/2023          19w 1d        45    70% Fetal Biometry ============  Standard BPD 45.0 mm 19w 4d 70% Hadlock EFW by: Hadlock (BPD-HC-AC-FL) Other Structures FHR 165 bpm General Evaluation ============== Cardiac activity present. FHR 165 bpm. Presentation: breech Placenta: anterior Maternal Structures =============== Uterus / Cervix Cervical length 44.0 mm Ovaries / Tubes / Adnexa Rt ovary: Normal Lt ovary: Normal  ============ Limited ultrasound The patient presents today for a limited ultrasound due to abdominal pain and cramping. On today's ultrasound, the amniotic fluid index is normal. The fetus is noted to be breech. Further disposition is per the primary  provider. This ultrasound was interpreted telemedically. Coding ====== Diagnoses                   O26.892: Pelvic Pain - Other specified pregnancy related conditions Procedures                 [phone]: Limited Ultrasound   Medications Administered:  Medications  promethazine  (PHENERGAN ) injection 12.5 mg (12.5 mg Intramuscular Refused 08/11/23 1147)  sodium chloride  0.9% (NS) bolus 1,000 mL (0 mL Intravenous Stopped 08/11/23 0925)    Discharge Medications (Medications Prescribed during this  ED visit and Patient's Home Medications) :    Your Medication List     ASK your doctor about these medications    albuterol  90 mcg/actuation inhaler Commonly known as: PROVENTIL  HFA;VENTOLIN  HFA Inhale 2 puffs.   albuterol  90 mcg/actuation inhaler Commonly known as: PROVENTIL  HFA;VENTOLIN  HFA Inhale 2 puffs every six (6) hours as needed.   cetirizine 10 MG tablet Commonly known as: ZYRTEC Take 1 tablet (10 mg total) by mouth daily.   fluticasone propionate 50 mcg/actuation nasal spray Commonly known as: FLONASE 1 spray into each nostril daily.          Cherie Ardeen Hanger, MD 08/11/23 [phone]

## 2023-08-14 DIAGNOSIS — Z3402 Encounter for supervision of normal first pregnancy, second trimester: Secondary | ICD-10-CM | POA: Diagnosis not present

## 2023-08-14 DIAGNOSIS — Z3A19 19 weeks gestation of pregnancy: Secondary | ICD-10-CM | POA: Diagnosis not present

## 2023-08-14 DIAGNOSIS — Z363 Encounter for antenatal screening for malformations: Secondary | ICD-10-CM | POA: Diagnosis not present

## 2023-09-10 DIAGNOSIS — Z362 Encounter for other antenatal screening follow-up: Secondary | ICD-10-CM | POA: Diagnosis not present

## 2023-09-10 DIAGNOSIS — Z3482 Encounter for supervision of other normal pregnancy, second trimester: Secondary | ICD-10-CM | POA: Diagnosis not present

## 2023-09-10 DIAGNOSIS — Z3A23 23 weeks gestation of pregnancy: Secondary | ICD-10-CM | POA: Diagnosis not present

## 2023-09-23 DIAGNOSIS — N771 Vaginitis, vulvitis and vulvovaginitis in diseases classified elsewhere: Secondary | ICD-10-CM | POA: Diagnosis not present

## 2023-09-23 DIAGNOSIS — Z7689 Persons encountering health services in other specified circumstances: Secondary | ICD-10-CM | POA: Diagnosis not present

## 2023-09-23 DIAGNOSIS — Z3A25 25 weeks gestation of pregnancy: Secondary | ICD-10-CM | POA: Diagnosis not present

## 2023-09-23 DIAGNOSIS — Z113 Encounter for screening for infections with a predominantly sexual mode of transmission: Secondary | ICD-10-CM | POA: Diagnosis not present

## 2023-09-23 DIAGNOSIS — Z3482 Encounter for supervision of other normal pregnancy, second trimester: Secondary | ICD-10-CM | POA: Diagnosis not present

## 2023-09-23 DIAGNOSIS — F411 Generalized anxiety disorder: Secondary | ICD-10-CM | POA: Diagnosis not present

## 2023-09-23 DIAGNOSIS — N760 Acute vaginitis: Secondary | ICD-10-CM | POA: Diagnosis not present

## 2023-10-06 DIAGNOSIS — J0140 Acute pansinusitis, unspecified: Secondary | ICD-10-CM | POA: Diagnosis not present

## 2023-10-06 DIAGNOSIS — F411 Generalized anxiety disorder: Secondary | ICD-10-CM | POA: Diagnosis not present

## 2023-10-08 DIAGNOSIS — Z23 Encounter for immunization: Secondary | ICD-10-CM | POA: Diagnosis not present

## 2023-10-08 DIAGNOSIS — Z3402 Encounter for supervision of normal first pregnancy, second trimester: Secondary | ICD-10-CM | POA: Diagnosis not present

## 2023-10-08 DIAGNOSIS — Z3A27 27 weeks gestation of pregnancy: Secondary | ICD-10-CM | POA: Diagnosis not present

## 2023-10-08 DIAGNOSIS — Z3480 Encounter for supervision of other normal pregnancy, unspecified trimester: Secondary | ICD-10-CM | POA: Diagnosis not present

## 2023-10-12 DIAGNOSIS — N760 Acute vaginitis: Secondary | ICD-10-CM | POA: Diagnosis not present

## 2023-10-12 DIAGNOSIS — R350 Frequency of micturition: Secondary | ICD-10-CM | POA: Diagnosis not present

## 2023-10-12 DIAGNOSIS — N939 Abnormal uterine and vaginal bleeding, unspecified: Secondary | ICD-10-CM | POA: Diagnosis not present

## 2023-10-12 DIAGNOSIS — Z3A28 28 weeks gestation of pregnancy: Secondary | ICD-10-CM | POA: Diagnosis not present

## 2023-10-12 DIAGNOSIS — R102 Pelvic and perineal pain: Secondary | ICD-10-CM | POA: Diagnosis not present

## 2023-10-12 DIAGNOSIS — O26899 Other specified pregnancy related conditions, unspecified trimester: Secondary | ICD-10-CM | POA: Diagnosis not present

## 2023-10-12 DIAGNOSIS — Z3493 Encounter for supervision of normal pregnancy, unspecified, third trimester: Secondary | ICD-10-CM | POA: Diagnosis not present

## 2023-10-12 DIAGNOSIS — Z3400 Encounter for supervision of normal first pregnancy, unspecified trimester: Secondary | ICD-10-CM | POA: Diagnosis not present

## 2023-10-12 DIAGNOSIS — F411 Generalized anxiety disorder: Secondary | ICD-10-CM | POA: Diagnosis not present

## 2023-10-20 DIAGNOSIS — F411 Generalized anxiety disorder: Secondary | ICD-10-CM | POA: Diagnosis not present

## 2023-10-20 DIAGNOSIS — Z3493 Encounter for supervision of normal pregnancy, unspecified, third trimester: Secondary | ICD-10-CM | POA: Diagnosis not present

## 2023-11-10 DIAGNOSIS — Z3483 Encounter for supervision of other normal pregnancy, third trimester: Secondary | ICD-10-CM | POA: Diagnosis not present

## 2023-11-10 DIAGNOSIS — Z3482 Encounter for supervision of other normal pregnancy, second trimester: Secondary | ICD-10-CM | POA: Diagnosis not present

## 2023-12-08 DIAGNOSIS — Z369 Encounter for antenatal screening, unspecified: Secondary | ICD-10-CM | POA: Diagnosis not present

## 2023-12-08 LAB — OB RESULTS CONSOLE GBS: GBS: NEGATIVE

## 2023-12-10 DIAGNOSIS — R030 Elevated blood-pressure reading, without diagnosis of hypertension: Secondary | ICD-10-CM | POA: Diagnosis not present

## 2023-12-10 DIAGNOSIS — O99891 Other specified diseases and conditions complicating pregnancy: Secondary | ICD-10-CM | POA: Diagnosis not present

## 2023-12-10 DIAGNOSIS — Z3A36 36 weeks gestation of pregnancy: Secondary | ICD-10-CM | POA: Diagnosis not present

## 2023-12-10 DIAGNOSIS — Z3400 Encounter for supervision of normal first pregnancy, unspecified trimester: Secondary | ICD-10-CM | POA: Diagnosis not present

## 2023-12-17 DIAGNOSIS — Z3493 Encounter for supervision of normal pregnancy, unspecified, third trimester: Secondary | ICD-10-CM | POA: Diagnosis not present

## 2023-12-22 ENCOUNTER — Inpatient Hospital Stay (EMERGENCY_DEPARTMENT_HOSPITAL)
Admission: AD | Admit: 2023-12-22 | Discharge: 2023-12-22 | Disposition: A | Discharge status: Home / Self Care | Source: Home / Self Care | Attending: Obstetrics and Gynecology | Admitting: Obstetrics and Gynecology

## 2023-12-22 ENCOUNTER — Other Ambulatory Visit: Payer: Self-pay

## 2023-12-22 ENCOUNTER — Encounter (HOSPITAL_COMMUNITY): Payer: Self-pay | Admitting: Obstetrics and Gynecology

## 2023-12-22 DIAGNOSIS — O99891 Other specified diseases and conditions complicating pregnancy: Secondary | ICD-10-CM | POA: Diagnosis not present

## 2023-12-22 DIAGNOSIS — Z3400 Encounter for supervision of normal first pregnancy, unspecified trimester: Secondary | ICD-10-CM | POA: Diagnosis not present

## 2023-12-22 DIAGNOSIS — Z3A38 38 weeks gestation of pregnancy: Secondary | ICD-10-CM

## 2023-12-22 DIAGNOSIS — O471 False labor at or after 37 completed weeks of gestation: Secondary | ICD-10-CM

## 2023-12-22 NOTE — MAU Note (Signed)
 MAU Labor Triage Note:  [name] is a 28 yMarieo. at 38Marie1 here in MAU reporting:  Contractions every: 5 minutes Onset of ctx: 1620 Pain Score: 8  Pain Location: Abdomen  ROM: no Vaginal Bleeding: brown started after membrane sweep in office Last SVE: 1/30/-3 Labor Pain Management Plan: Planning epidural and IV pain medication  GBS: Negative  Fetal Movement: Reports positive FM FHT: Fetal Heart Rate Mode: External Baseline Rate (A): 125 bpm  Vitals:   12/22/23 1808  BP: 128/86  Pulse: (!) 105  Resp: 18  Temp: 97Marie8 F (36Marie6 C)  SpO2: 99%      Lab orders placed from triage: MAU Labor Eval OB Office: [facility]

## 2023-12-22 NOTE — MAU Provider Note (Signed)
 Marie Smith is a 28 y.o. G2P0010 female at [redacted]w[redacted]d  RN Labor check, not seen by provider SVE by RN: Dilation: 1 Effacement (%): 30 Station: Ballotable Exam by:: N. Dario, RN NST: FHR baseline 130-135 bpm, Variability: moderate, Accelerations:present, Decelerations:  Absent= Cat 1/Reactive Toco: irregular, every 5 minutes  D/C home  Olam DELENA Dalton CNM, WHNP-BC 12/22/2023 6:18 PM

## 2023-12-23 ENCOUNTER — Encounter (HOSPITAL_COMMUNITY): Payer: Self-pay | Admitting: Obstetrics and Gynecology

## 2023-12-23 ENCOUNTER — Encounter (HOSPITAL_COMMUNITY): Payer: Self-pay

## 2023-12-23 ENCOUNTER — Inpatient Hospital Stay (HOSPITAL_COMMUNITY): Admitting: Anesthesiology

## 2023-12-23 ENCOUNTER — Inpatient Hospital Stay (HOSPITAL_COMMUNITY)
Admission: AD | Admit: 2023-12-23 | Discharge: 2023-12-25 | DRG: 806 | Disposition: A | Discharge status: Home / Self Care | Attending: Obstetrics and Gynecology | Admitting: Obstetrics and Gynecology

## 2023-12-23 ENCOUNTER — Other Ambulatory Visit: Payer: Self-pay

## 2023-12-23 DIAGNOSIS — Z8249 Family history of ischemic heart disease and other diseases of the circulatory system: Secondary | ICD-10-CM

## 2023-12-23 DIAGNOSIS — J4520 Mild intermittent asthma, uncomplicated: Secondary | ICD-10-CM | POA: Diagnosis present

## 2023-12-23 DIAGNOSIS — O26893 Other specified pregnancy related conditions, third trimester: Secondary | ICD-10-CM | POA: Diagnosis present

## 2023-12-23 DIAGNOSIS — Z3A38 38 weeks gestation of pregnancy: Secondary | ICD-10-CM

## 2023-12-23 DIAGNOSIS — O99334 Smoking (tobacco) complicating childbirth: Secondary | ICD-10-CM | POA: Diagnosis present

## 2023-12-23 DIAGNOSIS — O9952 Diseases of the respiratory system complicating childbirth: Secondary | ICD-10-CM | POA: Diagnosis present

## 2023-12-23 DIAGNOSIS — F17210 Nicotine dependence, cigarettes, uncomplicated: Secondary | ICD-10-CM | POA: Diagnosis present

## 2023-12-23 DIAGNOSIS — A6000 Herpesviral infection of urogenital system, unspecified: Secondary | ICD-10-CM | POA: Diagnosis present

## 2023-12-23 DIAGNOSIS — O9832 Other infections with a predominantly sexual mode of transmission complicating childbirth: Secondary | ICD-10-CM | POA: Diagnosis present

## 2023-12-23 LAB — CBC
HCT: 42 % (ref 36.0–46.0)
Hemoglobin: 14.3 g/dL (ref 12.0–15.0)
MCH: 29.1 pg (ref 26.0–34.0)
MCHC: 34 g/dL (ref 30.0–36.0)
MCV: 85.5 fL (ref 80.0–100.0)
Platelets: 261 K/uL (ref 150–400)
RBC: 4.91 MIL/uL (ref 3.87–5.11)
RDW: 13.2 % (ref 11.5–15.5)
WBC: 19.8 K/uL — ABNORMAL HIGH (ref 4.0–10.5)
nRBC: 0 % (ref 0.0–0.2)

## 2023-12-23 LAB — RPR: RPR Ser Ql: NONREACTIVE

## 2023-12-23 LAB — TYPE AND SCREEN
ABO/RH(D): O NEG
Antibody Screen: NEGATIVE

## 2023-12-23 MED ORDER — ZOLPIDEM TARTRATE 5 MG PO TABS: 5.0000 mg | ORAL_TABLET | Freq: Every evening | ORAL | Status: DC | PRN

## 2023-12-23 MED ORDER — OXYTOCIN-SODIUM CHLORIDE 30-0.9 UT/500ML-% IV SOLN
2.5000 [IU]/h | INTRAVENOUS | Status: DC
  Filled 2023-12-23: qty 500

## 2023-12-23 MED ORDER — DIPHENHYDRAMINE HCL 25 MG PO CAPS
25.0000 mg | ORAL_CAPSULE | Freq: Four times a day (QID) | ORAL | Status: DC | PRN
Start: 2023-12-23 — End: 2023-12-25

## 2023-12-23 MED ORDER — PHENYLEPHRINE 80 MCG/ML (10ML) SYRINGE FOR IV PUSH (FOR BLOOD PRESSURE SUPPORT)
80.0000 ug | PREFILLED_SYRINGE | INTRAVENOUS | Status: DC | PRN
  Administered 2023-12-23 (×4): 80 ug via INTRAVENOUS
  Filled 2023-12-23: qty 10

## 2023-12-23 MED ORDER — PRENATAL MULTIVITAMIN CH
1.0000 | ORAL_TABLET | Freq: Every day | ORAL | Status: DC
  Administered 2023-12-24: 1 via ORAL
  Filled 2023-12-23: qty 1

## 2023-12-23 MED ORDER — LACTATED RINGERS IV SOLN: INTRAVENOUS | Status: DC

## 2023-12-23 MED ORDER — ONDANSETRON HCL 4 MG/2ML IJ SOLN
4.0000 mg | Freq: Four times a day (QID) | INTRAMUSCULAR | Status: DC | PRN
  Administered 2023-12-23 (×2): 4 mg via INTRAVENOUS
  Filled 2023-12-23: qty 2

## 2023-12-23 MED ORDER — DIBUCAINE (PERIANAL) 1 % EX OINT: 1.0000 | TOPICAL_OINTMENT | CUTANEOUS | Status: DC | PRN

## 2023-12-23 MED ORDER — IBUPROFEN 600 MG PO TABS
600.0000 mg | ORAL_TABLET | Freq: Four times a day (QID) | ORAL | Status: DC
  Administered 2023-12-23 – 2023-12-25 (×7): 600 mg via ORAL
  Filled 2023-12-23 (×7): qty 1

## 2023-12-23 MED ORDER — LACTATED RINGERS IV SOLN
500.0000 mL | Freq: Once | INTRAVENOUS | Status: AC
  Administered 2023-12-23 (×2): 500 mL via INTRAVENOUS

## 2023-12-23 MED ORDER — DIPHENHYDRAMINE HCL 50 MG/ML IJ SOLN: 12.5000 mg | INTRAMUSCULAR | Status: DC | PRN

## 2023-12-23 MED ORDER — OXYTOCIN-SODIUM CHLORIDE 30-0.9 UT/500ML-% IV SOLN
1.0000 m[IU]/min | INTRAVENOUS | Status: DC
  Administered 2023-12-23 (×2): 2 m[IU]/min via INTRAVENOUS

## 2023-12-23 MED ORDER — ACETAMINOPHEN 325 MG PO TABS
650.0000 mg | ORAL_TABLET | ORAL | Status: DC | PRN
Start: 2023-12-23 — End: 2023-12-25
  Administered 2023-12-24 (×2): 650 mg via ORAL
  Filled 2023-12-23 (×2): qty 2

## 2023-12-23 MED ORDER — ONDANSETRON HCL 4 MG/2ML IJ SOLN: 4.0000 mg | INTRAMUSCULAR | Status: DC | PRN

## 2023-12-23 MED ORDER — LIDOCAINE-EPINEPHRINE (PF) 1.5 %-1:200000 IJ SOLN
INTRAMUSCULAR | Status: DC | PRN
Start: 2023-12-23 — End: 2023-12-23
  Administered 2023-12-23 (×2): 3 mL via EPIDURAL

## 2023-12-23 MED ORDER — FLEET ENEMA RE ENEM: 1.0000 | ENEMA | Freq: Every day | RECTAL | Status: DC | PRN

## 2023-12-23 MED ORDER — TERBUTALINE SULFATE 1 MG/ML IJ SOLN: 0.2500 mg | Freq: Once | INTRAMUSCULAR | Status: DC | PRN

## 2023-12-23 MED ORDER — SIMETHICONE 80 MG PO CHEW
80.0000 mg | CHEWABLE_TABLET | ORAL | Status: DC | PRN
Start: 2023-12-23 — End: 2023-12-25

## 2023-12-23 MED ORDER — PHENYLEPHRINE 80 MCG/ML (10ML) SYRINGE FOR IV PUSH (FOR BLOOD PRESSURE SUPPORT): 80.0000 ug | PREFILLED_SYRINGE | INTRAVENOUS | Status: DC | PRN

## 2023-12-23 MED ORDER — OXYCODONE-ACETAMINOPHEN 5-325 MG PO TABS: 1.0000 | ORAL_TABLET | ORAL | Status: DC | PRN

## 2023-12-23 MED ORDER — ONDANSETRON HCL 4 MG PO TABS: 4.0000 mg | ORAL_TABLET | ORAL | Status: DC | PRN

## 2023-12-23 MED ORDER — COCONUT OIL OIL: 1.0000 | TOPICAL_OIL | Status: DC | PRN

## 2023-12-23 MED ORDER — CALCIUM CARBONATE ANTACID 500 MG PO CHEW
2.0000 | CHEWABLE_TABLET | ORAL | Status: DC | PRN
  Administered 2023-12-23 (×4): 400 mg via ORAL
  Filled 2023-12-23 (×2): qty 2

## 2023-12-23 MED ORDER — EPHEDRINE 5 MG/ML INJ
10.0000 mg | INTRAVENOUS | Status: DC | PRN
  Filled 2023-12-23: qty 5

## 2023-12-23 MED ORDER — ACETAMINOPHEN 325 MG PO TABS
650.0000 mg | ORAL_TABLET | ORAL | Status: DC | PRN
Start: 2023-12-23 — End: 2023-12-23
  Administered 2023-12-23 (×2): 650 mg via ORAL
  Filled 2023-12-23: qty 2

## 2023-12-23 MED ORDER — BENZOCAINE-MENTHOL 20-0.5 % EX AERO
1.0000 | INHALATION_SPRAY | CUTANEOUS | Status: DC | PRN
  Administered 2023-12-23 (×2): 1 via TOPICAL
  Filled 2023-12-23: qty 56

## 2023-12-23 MED ORDER — WITCH HAZEL-GLYCERIN EX PADS
1.0000 | MEDICATED_PAD | CUTANEOUS | Status: DC | PRN
  Administered 2023-12-23 – 2023-12-24 (×3): 1 via TOPICAL

## 2023-12-23 MED ORDER — FLEET ENEMA RE ENEM
1.0000 | ENEMA | RECTAL | Status: DC | PRN
Start: 2023-12-23 — End: 2023-12-23

## 2023-12-23 MED ORDER — LACTATED RINGERS IV SOLN
500.0000 mL | INTRAVENOUS | Status: DC | PRN
  Administered 2023-12-23 (×2): 500 mL via INTRAVENOUS

## 2023-12-23 MED ORDER — MEASLES, MUMPS & RUBELLA VAC IJ SOLR: 0.5000 mL | Freq: Once | INTRAMUSCULAR | Status: DC

## 2023-12-23 MED ORDER — SENNOSIDES-DOCUSATE SODIUM 8.6-50 MG PO TABS
2.0000 | ORAL_TABLET | Freq: Every day | ORAL | Status: DC
  Administered 2023-12-24 – 2023-12-25 (×2): 2 via ORAL
  Filled 2023-12-23 (×2): qty 2

## 2023-12-23 MED ORDER — OXYTOCIN BOLUS FROM INFUSION
333.0000 mL | Freq: Once | INTRAVENOUS | Status: AC
  Administered 2023-12-23 (×2): 333 mL via INTRAVENOUS

## 2023-12-23 MED ORDER — FENTANYL-BUPIVACAINE-NACL 0.5-0.125-0.9 MG/250ML-% EP SOLN
12.0000 mL/h | EPIDURAL | Status: DC | PRN
  Administered 2023-12-23 (×2): 12 mL/h via EPIDURAL
  Filled 2023-12-23: qty 250

## 2023-12-23 MED ORDER — SOD CITRATE-CITRIC ACID 500-334 MG/5ML PO SOLN
30.0000 mL | ORAL | Status: DC | PRN
  Administered 2023-12-23 (×2): 30 mL via ORAL
  Filled 2023-12-23: qty 30

## 2023-12-23 MED ORDER — MEDROXYPROGESTERONE ACETATE 150 MG/ML IM SUSP: 150.0000 mg | INTRAMUSCULAR | Status: DC | PRN

## 2023-12-23 MED ORDER — LIDOCAINE HCL (PF) 1 % IJ SOLN: 30.0000 mL | INTRAMUSCULAR | Status: DC | PRN

## 2023-12-23 MED ORDER — OXYCODONE-ACETAMINOPHEN 5-325 MG PO TABS: 2.0000 | ORAL_TABLET | ORAL | Status: DC | PRN

## 2023-12-23 MED ORDER — EPHEDRINE 5 MG/ML INJ: 10.0000 mg | INTRAVENOUS | Status: DC | PRN

## 2023-12-23 MED ORDER — BISACODYL 10 MG RE SUPP: 10.0000 mg | Freq: Every day | RECTAL | Status: DC | PRN

## 2023-12-23 MED ORDER — TETANUS-DIPHTH-ACELL PERTUSSIS 5-2.5-18.5 LF-MCG/0.5 IM SUSY: 0.5000 mL | PREFILLED_SYRINGE | Freq: Once | INTRAMUSCULAR | Status: DC

## 2023-12-23 NOTE — MAU Note (Signed)
 Marie Smith is a 28 y.o. at [redacted]w[redacted]d here in MAU reporting: pt brought back from lobby via wc at [phone].  Pt very uncomfortable, assisted with clothing change and into gown.  Reports membranes stripped yesterday.  Came in last night was one CM.  Lost her plug,  has had some bloody mucous. No LOF.  Reports +FM. Pt denies any problems with preg.  Neg GBS.   Onset of complaint: 12hrs ago Pain score: 5000 There were no vitals filed for this visit.   FHT:140 Lab orders placed from triage:  none

## 2023-12-23 NOTE — Anesthesia Procedure Notes (Signed)
 Epidural Patient location during procedure: OB Start time: 12/23/2023 9:45 AM End time: 12/23/2023 9:59 AM  Staffing Anesthesiologist: Darlyn Rush, MD Performed: anesthesiologist   Preanesthetic Checklist Completed: patient identified, IV checked, risks and benefits discussed, monitors and equipment checked, pre-op evaluation and timeout performed  Epidural Patient position: sitting Prep: DuraPrep and site prepped and draped Patient monitoring: heart rate, continuous pulse ox and blood pressure Approach: midline Location: L2-L3 Injection technique: LOR air and LOR saline  Needle:  Needle type: Tuohy  Needle gauge: 17 G Needle length: 9 cm Needle insertion depth: 4.5 cm Catheter type: closed end flexible Catheter size: 19 Gauge Catheter at skin depth: 10.5 cm Test dose: negative and 1.5% lidocaine  with Epi 1:200 K  Assessment Sensory level: T8 Events: blood not aspirated, no cerebrospinal fluid, injection not painful, no injection resistance, no paresthesia and negative IV test  Additional Notes Reason for block:procedure for pain

## 2023-12-23 NOTE — H&P (Signed)
 Marie Smith is a 28 y.o.  G 2 P 0 at 38 w 2 days presents in active labor - 5 cm. Desires epidural. GBBS is negative. OB History     Gravida  2   Para      Term      Preterm      AB  1   Living         SAB  1   IAB      Ectopic      Multiple      Live Births             Past Medical History:  Diagnosis Date   Abdominal pain, epigastric    ADHD (attention deficit hyperactivity disorder)    ADHD   Anxiety and depression 01/27/2012   Asthma    Asthma, mild intermittent    Bipolar and related disorder (HCC) 01/27/2012   Eczema 02/19/2012   Hematuria 05/26/2012   Herpes labialis 08/18/2011   Otitis media of left ear 02/19/2012   Pharyngitis 08/18/2011   Pharyngitis 05/26/2012   Preventative health care 08/19/2011   Reflux    Sinusitis 05/26/2012   No past surgical history on file. Family History: family history includes COPD in her paternal grandmother; Cancer in her paternal grandmother; Emphysema in her paternal grandmother; Heart attack in her paternal grandmother; Mental illness in her paternal grandmother; Stroke in her paternal grandmother. Social History:  reports that she has been smoking cigarettes. She has never used smokeless tobacco. She reports current alcohol use. She reports that she does not use drugs.     Maternal Diabetes: No Genetic Screening: Normal Maternal Ultrasounds/Referrals: Normal Fetal Ultrasounds or other Referrals:  None Maternal Substance Abuse:  No Significant Maternal Medications:  None Significant Maternal Lab Results:  Group B Strep negative Number of Prenatal Visits:greater than 3 verified prenatal visits Maternal Vaccinations:TDap Other Comments:  None  Review of Systems Maternal Medical History:  Reason for admission: Contractions.   Contractions: Perceived severity is strong.   Fetal activity: Perceived fetal activity is normal.     Dilation: 5 Effacement (%): 80 Station: -2 Exam by:: jolynn Blood pressure  129/88, pulse (!) 112, resp. rate 18, SpO2 100%, unknown if currently breastfeeding. Maternal Exam:  Uterine Assessment: Contraction strength is moderate.  Contraction frequency is regular.  Abdomen: Fetal presentation: vertex   Fetal Exam Fetal State Assessment: Category I - tracings are normal.   Physical Exam Vitals and nursing note reviewed. Exam conducted with a chaperone present.  Constitutional:      Appearance: Normal appearance.  HENT:     Head: Normocephalic.     Right Ear: Tympanic membrane normal.  Cardiovascular:     Rate and Rhythm: Normal rate and regular rhythm.  Pulmonary:     Effort: Pulmonary effort is normal.  Neurological:     Mental Status: She is alert.     Prenatal labs: ABO, Rh: --/--/PENDING (08/13 1000) Antibody: PENDING (08/13 1000) Rubella:   RPR:    HBsAg:    HIV:    GBS:     Assessment/Plan: IUP at term Labor Epidural  Anticipate NSVD  Rosaline LITTIE Cobble 12/23/2023, 9:38 AM

## 2023-12-23 NOTE — Lactation Note (Signed)
 This note was copied from a baby's chart. Lactation Consultation Note  Patient Name: Marie Smith Unijb'd Date: 12/23/2023 Age:29 hours Reason for consult: Initial assessment;1st time breastfeeding;Exclusive pumping and bottle feeding  [name] informed LC she wants to pump only and formula feed infant, [name] declined donor breast milk.LC set [name] up with the DEBP and [name] was fitted with 18 mm breast flange on her left breast and 21 mm breast flange on her right. [name] was expressing colostrum in breast flanges as LC left the room. Infant was pace feed 11 mls of 20 kcal formula using extra slow flow bottle nipple. [name] understands to feed infant by cues, on demand, 8-12 times within 24 hours. LC discussed hunger cues with [name]. [name] will offer infant any EBM firs that is pumped by spoon or finger feeding 1st day of life and afterwards supplement with formula. [name] knows on Day 1 to offer 5-15 mls per feeding or more if infant wants it. [name] knows to call if she has any BF questions or concerns. [name] was  made aware of O/P services, breastfeeding support groups, community resources, and our phone # for post-discharge questions.     Maternal Data Has patient been taught Hand Expression?: No Does the patient have breastfeeding experience prior to this delivery?: No  Feeding Mother's Current Feeding Choice: Breast Milk and Formula  LATCH Score                    Lactation Tools Discussed/Used Tools: Flanges;Pump Flange Size: 18;21 ([name] is usig 18 mm breast flange on her right breast and 21 mm on her left breast) Breast pump type: Double-Electric Breast Pump Pump Education: Setup, frequency, and cleaning;Milk Storage Reason for Pumping: [name] decided to Pump Only and formula fed infant Pumping frequency: [name] will continue to use the DEBP evry 3 hours for 15 minutes on inital setting. Pumped volume: 2 mL ([name] was still expressing colostrum when LC left the room)  Interventions Interventions:  Breast feeding basics reviewed;Expressed milk;Hand pump;DEBP;Education;Pace feeding;LC Services brochure;CDC milk storage guidelines;CDC Guidelines for Breast Pump Cleaning  Discharge Pump: DEBP;Personal  Consult Status Consult Status: Follow-up Date: 12/24/23 Follow-up type: In-patient    Grayce LULLA Batter 12/23/2023, 9:33 PM

## 2023-12-23 NOTE — Anesthesia Preprocedure Evaluation (Addendum)
 Anesthesia Evaluation  Patient identified by MRN, date of birth, ID band Patient awake    Reviewed: Allergy & Precautions, Patient's Chart, lab work & pertinent test results  Airway Mallampati: II  TM Distance: >3 FB Neck ROM: Full    Dental no notable dental hx.    Pulmonary asthma , Current Smoker   Pulmonary exam normal breath sounds clear to auscultation       Cardiovascular negative cardio ROS Normal cardiovascular exam Rhythm:Regular Rate:Normal     Neuro/Psych negative neurological ROS     GI/Hepatic negative GI ROS, Neg liver ROS,,,  Endo/Other  negative endocrine ROS    Renal/GU negative Renal ROS     Musculoskeletal negative musculoskeletal ROS (+)    Abdominal  (+) - obese  Peds  Hematology negative hematology ROS (+)   Anesthesia Other Findings   Reproductive/Obstetrics (+) Pregnancy                              Anesthesia Physical Anesthesia Plan  ASA: 2  Anesthesia Plan: Epidural   Post-op Pain Management:    Induction:   PONV Risk Score and Plan:   Airway Management Planned:   Additional Equipment:   Intra-op Plan:   Post-operative Plan:   Informed Consent: I have reviewed the patients History and Physical, chart, labs and discussed the procedure including the risks, benefits and alternatives for the proposed anesthesia with the patient or authorized representative who has indicated his/her understanding and acceptance.     Dental advisory given  Plan Discussed with: CRNA  Anesthesia Plan Comments: (Risks of epidural analgesia explained at length. This includes, but is not limited to, bleeding, infection, reactions to the medications, failure of the block, seizures, headache, damage to surrounding structures, damage to nerves, permanent weakness, numbness, tingling and pain. All patient questions were answered and patient wishes to proceed with epidural  analgesia.  )         Anesthesia Quick Evaluation

## 2023-12-24 LAB — CBC
HCT: 32.5 % — ABNORMAL LOW (ref 36.0–46.0)
Hemoglobin: 10.8 g/dL — ABNORMAL LOW (ref 12.0–15.0)
MCH: 29.5 pg (ref 26.0–34.0)
MCHC: 33.2 g/dL (ref 30.0–36.0)
MCV: 88.8 fL (ref 80.0–100.0)
Platelets: 210 K/uL (ref 150–400)
RBC: 3.66 MIL/uL — ABNORMAL LOW (ref 3.87–5.11)
RDW: 13.4 % (ref 11.5–15.5)
WBC: 17.9 K/uL — ABNORMAL HIGH (ref 4.0–10.5)
nRBC: 0 % (ref 0.0–0.2)

## 2023-12-24 NOTE — Clinical Social Work Maternal (Signed)
 CLINICAL SOCIAL WORK MATERNAL/CHILD NOTE   Patient Details  Name: Marie Smith MRN: [id number] Date of Birth: 09/30/1995   Date:  2023/12/25   Clinical Social Worker Initiating Note:  Shanautica Forker      Date/Time: Initiated:  12/24/23/1529      Child's Name:  Marie Smith 02/11/24    Biological Parents:  Mother, Father Marie Smith 03/11/1995, Marie Smith 09/23/1995)    Need for Interpreter:  None    Reason for Referral:  Behavioral Health Concerns    Address:  [address] [place] [address]    Phone number:  There are no phone numbers on file.     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    Education:  [employer] school graduate    Homebound arranged:     Surveyor, quantity Resources:  Media planner     Other Resources:       Cultural/Religious Considerations Which May Impact Care:     Strengths:  Home prepared for child  , Ability to meet basic needs  , Compliance with medical plan      Psychotropic Medications:          Pediatrician:        Pediatrician List:    [place]   [place]   [facility]       Pediatrician Fax Number:     Risk Factors/Current Problems:  None    Cognitive State:  Alert  , Able to Concentrate      Mood/Affect:  Calm  , Comfortable      CSW Assessment: CSW received a consult for [name] hx of Anxiety, Depression and Bipolar. CSW met with [name] to complete assessment and offer support. CSW entered the room and observed [name] walking around the room and MGM at bedside holding the infant. CSW  introduced self, CSW role and reason for visit. [name] was agreeable to visit and allowed her mom to remain in the room. CSW inquired about how [name] was feeling, [name] reported good. CSW confirmed [name] address and phone number, [name] verified the information on file was correct. CSW inquired about [name] MH hx, [name] reported she experienced an episode in 2013 and was diagnosed with Bipolar, [name] reported she feels it was a misdiagnosis since she has had no other symptoms since and no treatment for Bipolar. [name] reported she does experience Generalized Anxiety but does not take any medication at this time. CSW inquired about noted Prozac prescription, [name] reported she wants to talk to her PCP, but does not feel the need for the medication at this time. CSW assessed for safety, [name] denied any SI or HI. [name] reported a stable mood and no MH concerns at this time. CSW provided education regarding the baby blues period vs. perinatal mood disorders, discussed treatment and Smith resources for mental health follow up if  concerns arise.  CSW recommends self-evaluation during the postpartum time period using the New Mom Checklist from Postpartum Progress and encouraged [name] to contact a medical professional if symptoms are noted at any time.  [name] identified her mom, FOB, her dad and FOB's mom as her supports.    CSW provided review of Sudden Infant Death Syndrome (SIDS) precautions.  [name] reported she has all essential items for the infant including a bassinet and car seat.  CSW identifies no further need for intervention and no barriers to discharge at this time.   CSW Plan/Description:  No Further Intervention Required/No Barriers to Discharge, Sudden Infant Death Syndrome (SIDS) Education, Perinatal Mood and Anxiety Disorder (PMADs) Education      Eliazar Marie Gave, LCSW February 02, 2024, 3:32 PM

## 2023-12-24 NOTE — Anesthesia Postprocedure Evaluation (Signed)
 Anesthesia Post Note  Patient: Marie Smith  Procedure(s) Performed: AN AD HOC LABOR EPIDURAL     Patient location during evaluation: Mother Baby Anesthesia Type: Epidural Level of consciousness: awake, oriented and awake and alert Pain management: pain level controlled Vital Signs Assessment: post-procedure vital signs reviewed and stable Respiratory status: spontaneous breathing, nonlabored ventilation and respiratory function stable Cardiovascular status: stable Postop Assessment: no headache, patient able to bend at knees, adequate PO intake, able to ambulate and no apparent nausea or vomiting Anesthetic complications: no   No notable events documented.  Last Vitals:  Vitals:   12/24/23 0016 12/24/23 0423  BP: 106/63 99/74  Pulse: 91 71  Resp: 18 18  Temp: 36.9 C 36.6 C  SpO2: 99% 97%    Last Pain:  Vitals:   12/24/23 0727  TempSrc:   PainSc: 6    Pain Goal:                   Jarah Pember

## 2023-12-24 NOTE — Progress Notes (Signed)
 Postpartum Progress Note  S: No complaints. Feeling well. Lochia appropriate. No subjective fevers/chills.   O:     12/24/2023    8:27 AM 12/24/2023    4:23 AM 12/24/2023   12:16 AM  Vitals with BMI  Systolic 110 99 106  Diastolic 81 74 63  Pulse 74 71 91    Gen: NAD, A&O Pulm: NWOB Abd: soft, appropriately ttp, fundus firm and below Umb Ext: No evidence of DVT, no edema b/l  Labs Recent Results (from the past 2160 hours)  OB RESULT CONSOLE Group B Strep     Status: None   Collection Time: 12/08/23 12:00 AM  Result Value Ref Range   GBS Negative   Type and screen [facility]     Status: None   Collection Time: 12/23/23  8:58 AM  Result Value Ref Range   ABO/RH(D) O NEG    Antibody Screen NEG    Sample Expiration      12/26/2023,2359 Performed at [facility], [address] [place], [place] [address]   CBC     Status: Abnormal   Collection Time: 12/23/23  9:02 AM  Result Value Ref Range   WBC 19.8 (H) 4.0 - 10.5 K/uL   RBC 4.91 3.87 - 5.11 MIL/uL   Hemoglobin 14.3 12.0 - 15.0 g/dL   HCT 57.9 63.9 - 53.9 %   MCV 85.5 80.0 - 100.0 fL   MCH 29.1 26.0 - 34.0 pg   MCHC 34.0 30.0 - 36.0 g/dL   RDW 86.7 88.4 - 84.4 %   Platelets 261 150 - 400 K/uL   nRBC 0.0 0.0 - 0.2 %    Comment: Performed at [facility], [address] [place], [place] [address]  RPR     Status: None   Collection Time: 12/23/23  9:02 AM  Result Value Ref Range   RPR Ser Ql NON REACTIVE NON REACTIVE    Comment: Performed at [facility], [address] [place], [place] [address]  CBC     Status: Abnormal   Collection Time: 12/24/23  4:38 AM  Result Value Ref Range   WBC 17.9 (H) 4.0 - 10.5 K/uL   RBC 3.66 (L) 3.87 - 5.11 MIL/uL   Hemoglobin 10.8 (L) 12.0 - 15.0 g/dL   HCT 67.4 (L) 63.9 - 53.9 %   MCV 88.8 80.0 - 100.0 fL   MCH 29.5 26.0 - 34.0 pg   MCHC 33.2 30.0 - 36.0 g/dL   RDW 86.5 88.4 - 84.4 %   Platelets 210 150 - 400 K/uL   nRBC 0.0 0.0 - 0.2 %     Comment: Performed at [facility], [address], [place], [place] [address]   A/P:  PPD1 s/p SVD, doing well pp. AFVSS. Benign exam.  Desires circ - not yet cleared by peds, will perform when cleared. R/b reviewed. Continue present care. Plan for d/c PPD#2.   Slater Door, MD

## 2023-12-25 MED ORDER — FAMOTIDINE 40 MG/5ML PO SUSR
20.0000 mg | Freq: Every day | ORAL | Status: DC | PRN
  Administered 2023-12-25: 20 mg via ORAL
  Filled 2023-12-25 (×2): qty 2.5

## 2023-12-25 NOTE — Lactation Note (Addendum)
 This note was copied from a baby's chart. Lactation Consultation Note  Patient Name: Marie Smith Date: 12/25/2023 Age:29 hours Reason for consult: Follow-up assessment;Primapara;Early term 37-38.6wks;Exclusive pumping and bottle feeding  P1, Mother has been formula feeding but planned to start pumping once home. Baby has been consuming 5- 27 ml of 20 kcal formula with purple Enfamil extra slow flow nipple.  Per RN baby has been spitting up after feedings.   LC reviewed engorgement care and monitoring voids/stools. Mother requested LC fit her flanges.  18 mm flanges seem appropriate at this time on both breasts.  Mother willing to pump.  Mother pumped 43 ml with Symphony DEBP and was happy to view large volume. Praised mother for her efforts.  Discussed benefits of breastmilk for baby. Baby sleeping on grandmother's chest after recent formula feeding.  Discussed if mother chooses to pump, she needs to pump a minimum of 8 times per day.  Ideally q 3 hours for 15 min both breasts.  Reviewed milk storage and suggest giving 20-25 ml to baby at next feeding and be sure to hold him upright after feeding for 15 min and burp.  Suggest calling LC to help with feeding or pumping as needed.  Maternal Data Has patient been taught Hand Expression?: Yes Does the patient have breastfeeding experience prior to this delivery?: No  Feeding Mother's Current Feeding Choice: Breast Milk and Formula Nipple Type: Slow - flow  Lactation Tools Discussed/Used Tools: Flanges;Pump Flange Size: 18 Breast pump type: Double-Electric Breast Pump;Manual Pump Education: Setup, frequency, and cleaning;Milk Storage Reason for Pumping: mother's choice Pumping frequency: q 3 hours for 15 min Pumped volume: 43 mL  Interventions Interventions: Education;DEBP  Discharge Discharge Education: Engorgement and breast care;Warning signs for feeding baby Pump: DEBP;Manual  Consult Status Consult  Status: Complete Date: 12/25/23  Shannon Levorn Lemme  RN, IBCLC 12/25/2023, 9:19 AM

## 2023-12-25 NOTE — Lactation Note (Signed)
 This note was copied from a baby's chart. Lactation Consultation Note  Patient Name: Marie Smith Date: 12/25/2023 Age:29 hours Reason for consult: Follow-up assessment;Primapara;1st time breastfeeding;Early term 37-38.6wks;RN request  P1- RN requested for LC to assess [name]'s flange fitting as she was pumping. [name] was using the 18 mm flanges. LC believes that this is a good fit at this time. [name] is pumping out large volumes of transitional milk at this time. Between 40-60 mL. LC reviewed safe storage with [name] and how to order flange inserts for her home pump. During this consult. Infant had a large emesis episode that covered his shirt, his pants and his crib. [name] reports that this is how his emesis episodes have looked after every feeding since birth, but she feels that it is getting better now that he is on breast milk only. LC encouraged [name] to call for further assistance as needed.  Maternal Data Has patient been taught Hand Expression?: Yes Does the patient have breastfeeding experience prior to this delivery?: No  Feeding Mother's Current Feeding Choice: Breast Milk Nipple Type: Nfant Standard Flow (white)  Lactation Tools Discussed/Used Tools: Pump;Flanges Flange Size: 18 Breast pump type: Double-Electric Breast Pump;Manual Pump Education: Setup, frequency, and cleaning;Milk Storage Reason for Pumping: MD request due to volume intake Pumping frequency: 15-20 min every 3 hrs  Interventions Interventions: Breast feeding basics reviewed;Expressed milk;Hand pump;DEBP;Education;LC Services brochure  Discharge Discharge Education: Engorgement and breast care;Warning signs for feeding baby Pump: Hands Free;Personal  Consult Status Consult Status: Complete Date: 12/25/23    Recardo Hoit BS, IBCLC 12/25/2023, 5:30 PM

## 2023-12-25 NOTE — Discharge Summary (Signed)
 Postpartum Discharge Summary  Date of Service updated 12/25/23     Patient Name: Marie Smith DOB: December 16, 1995 MRN: [id number]  Date of admission: 12/23/2023 Delivery date:12/23/2023 Delivering provider: MAT BROWNING Date of discharge: 12/25/2023  Admitting diagnosis: Indication for care in labor or delivery [O75.9] Normal labor [O80, Z37.9] Vacuum extractor delivery, delivered [O75.9] Intrauterine pregnancy: [redacted]w[redacted]d     Secondary diagnosis:  Principal Problem:   Indication for care in labor or delivery Active Problems:   Normal labor   Vacuum extractor delivery, delivered  Additional problems: None    Discharge diagnosis: Term Pregnancy Delivered                                              Post partum procedures:None Augmentation: N/A Complications: None  Hospital course: Onset of Labor With Vaginal Delivery      28 y.o. yo G2P1011 at [redacted]w[redacted]d was admitted in Latent Labor on 12/23/2023. Labor course was complicated by none  Membrane Rupture Time/Date: 10:19 AM,12/23/2023  Delivery Method:Vaginal, Vacuum (Extractor) Operative Delivery: VAVD with kiwi Episiotomy: None Lacerations:  2nd degree;Perineal Patient had a postpartum course complicated by none.  She is ambulating, tolerating a regular diet, passing flatus, and urinating well. Patient is discharged home in stable condition on 12/25/23.  Newborn Data: Birth date:12/23/2023 Birth time:6:20 PM Gender:Female Living status:Living Apgars:8 ,9  Weight:2980 g  Magnesium  Sulfate received: No BMZ received: No Rhophylac :No baby RH negative MMR:N/A T-DaP:Given prenatally Flu: N/A Immunizations administered: Immunization History  Administered Date(s) Administered   DTaP 02/17/1995, 04/27/1995, 06/24/1995, 07/15/1996, 09/20/1999   HIB (PRP-OMP) 02/17/1995, 04/27/1995, 06/24/1995, 07/15/1996   HPV Quadrivalent 05/20/2007, 07/21/2007   Hepatitis A 03/04/2005, 12/23/2005   Hepatitis B March 20, 1995, 02/17/1995,  09/23/1995   IPV 02/17/1995, 04/27/1995, 06/24/1995, 09/20/1999   Influenza Split 02/19/2012   Influenza Whole 03/30/2001   Influenza,inj,Quad PF,6+ Mos 02/18/2013, 03/29/2014   MMR 11/25/1995, 09/20/1999   Meningococcal Conjugate 05/20/2007   Pneumococcal Conjugate-13 09/20/1999   Td 05/20/2007   Tdap 05/20/2007   Varicella 07/15/1996, 12/23/2005    Physical exam  Vitals:   12/24/23 0827 12/24/23 1500 12/24/23 2028 12/25/23 0453  BP: 110/81 106/70 112/85 114/80  Pulse: 74 75 87 76  Resp: 18 18 18 18   Temp: 97.6 F (36.4 C) 97.7 F (36.5 C) 98 F (36.7 C) 97.9 F (36.6 C)  TempSrc: Oral Oral Oral Oral  SpO2:   100% 100%   General: alert, cooperative, and no distress Lochia: appropriate Uterine Fundus: firm Incision: N/A DVT Evaluation: No evidence of DVT seen on physical exam. Labs: Lab Results  Component Value Date   WBC 17.9 (H) 12/24/2023   HGB 10.8 (L) 12/24/2023   HCT 32.5 (L) 12/24/2023   MCV 88.8 12/24/2023   PLT 210 12/24/2023      Latest Ref Rng & Units 06/09/2020    1:35 AM  CMP  Glucose 70 - 99 mg/dL 73   BUN 6 - 20 mg/dL 7   Creatinine 9.55 - 8.99 mg/dL 9.06   Sodium 864 - 854 mmol/L 139   Potassium 3.5 - 5.1 mmol/L 4.5   Chloride 98 - 111 mmol/L 102   CO2 22 - 32 mmol/L 24   Calcium  8.9 - 10.3 mg/dL 9.9   Total Protein 6.5 - 8.1 g/dL 7.4   Total Bilirubin 0.3 - 1.2 mg/dL 0.5   Alkaline Phos 38 -  126 U/L 71   AST 15 - 41 U/L 20   ALT 0 - 44 U/L 18    Edinburgh Score:    12/24/2023    5:42 PM  Edinburgh Postnatal Depression Scale Screening Tool  I have been able to laugh and see the funny side of things. 0  I have looked forward with enjoyment to things. 0  I have blamed myself unnecessarily when things went wrong. 0  I have been anxious or worried for no good reason. 2  I have felt scared or panicky for no good reason. 0  Things have been getting on top of me. 1  I have been so unhappy that I have had difficulty sleeping. 0  I have felt  sad or miserable. 0  I have been so unhappy that I have been crying. 0  The thought of harming myself has occurred to me. 0  Edinburgh Postnatal Depression Scale Total 3      After visit meds:  Allergies as of 12/25/2023   No Known Allergies      Medication List     STOP taking these medications    amphetamine -dextroamphetamine  10 MG tablet Commonly known as: ADDERALL   amphetamine -dextroamphetamine  30 MG 24 hr capsule Commonly known as: ADDERALL XR   beclomethasone 80 MCG/ACT inhaler Commonly known as: Qvar    hydrOXYzine  25 MG tablet Commonly known as: ATARAX    omeprazole 20 MG capsule Commonly known as: PRILOSEC   ranitidine  150 MG tablet Commonly known as: Zantac        TAKE these medications    albuterol  108 (90 Base) MCG/ACT inhaler Commonly known as: VENTOLIN  HFA Inhale 2 puffs into the lungs every 6 (six) hours as needed for wheezing or shortness of breath.   cetirizine 10 MG tablet Commonly known as: ZYRTEC Take 10 mg by mouth daily.   multivitamin-prenatal 27-0.8 MG Tabs tablet Take 1 tablet by mouth daily at 12 noon.         Discharge home in stable condition Infant Feeding: Bottle and Breast Infant Disposition:home with mother Discharge instruction: per After Visit Summary and Postpartum booklet. Activity: Advance as tolerated. Pelvic rest for 6 weeks.  Diet: routine diet Anticipated Birth Control: Unsure Postpartum Appointment:6 weeks Additional Postpartum F/U: None Future Appointments: Future Appointments  Date Time Provider Department Center  01/12/2024  6:30 AM [facility] SCHED ROOM [facility] None   Follow up Visit:      12/25/2023 Kelly Delon Milian, MD

## 2023-12-26 NOTE — Lactation Note (Signed)
 This note was copied from a baby's chart. Lactation Consultation Note  Patient Name: Marie Smith Unijb'd Date: 12/26/2023 Age:29 hours  Reason for consult: Follow-up assessment;Exclusive pumping and bottle feeding;Early term 37-38.6wks;Primapara;1st time breastfeeding  P1, [redacted]w[redacted]d, 3.3% weight loss ( gained 14 g)  Mother reports baby is tolerating her breast milk and not spitting up after feedings. They have seen SLP this am. Mother's milk is coming to volume. She expressed 43 ml this am. Mother plans to exclusively pump and is considering renting the symphony pump from the gift shop and then transition to her hand's free pump once her milk is well established.   Discussed management of engorgement, signs and symptom of mastitis and if occurs to report to patient's OB provider. Discussed Lactogenesis II/ supply and demand for maintaining milk supply.        Feeding Mother's Current Feeding Choice: Breast Milk Nipple Type: Nfant Standard Flow (white)    Interventions Interventions: Education  Discharge Discharge Education: Engorgement and breast care;Warning signs for feeding baby;Outpatient recommendation Pump: Hands Free;Personal (considering renting a symphony pump)  Consult Status Consult Status: Complete Date: 12/26/23    Joshua Rojelio HERO 12/26/2023, 10:32 AM

## 2024-01-02 ENCOUNTER — Telehealth: Admitting: Family Medicine

## 2024-01-02 DIAGNOSIS — O9089 Other complications of the puerperium, not elsewhere classified: Secondary | ICD-10-CM

## 2024-01-02 NOTE — Progress Notes (Signed)
 Pt is advised to call her OB on call. No charge. DWB

## 2024-01-04 ENCOUNTER — Telehealth (HOSPITAL_COMMUNITY): Payer: Self-pay | Admitting: *Deleted

## 2024-01-04 NOTE — Telephone Encounter (Signed)
 01/04/2024  Name: Marie Smith MRN: [id number] DOB: 01-03-1996  Reason for Call:  Transition of Care Hospital Discharge Call  Contact Status: Patient Contact Status: Complete  Language assistant needed:          Follow-Up Questions: Do You Have Any Concerns About Your Health As You Heal From Delivery?: Yes What Concerns Do You Have About Your Health?: Patient reported that she has been taking antibiotics for 2days because I got mastitis in both of my breasts. Stated, I was pumping, and then I wanted to dry up my milk so I stopped and that's how I got it. Reported improvement in symptoms. Patient asked if she should continue to pump. RN counseled her to pump only to comfort to minimize breast stimulation and milk production. Do You Have Any Concerns About Your Infants Health?: No  Edinburgh Postnatal Depression Scale:  In the Past 7 Days: I have been able to laugh and see the funny side of things.: As much as I always could I have looked forward with enjoyment to things.: As much as I ever did I have blamed myself unnecessarily when things went wrong.: No, never I have been anxious or worried for no good reason.: No, not at all I have felt scared or panicky for no good reason.: No, not at all Things have been getting on top of me.: No, I have been coping as well as ever I have been so unhappy that I have had difficulty sleeping.: Not at all I have felt sad or miserable.: No, not at all I have been so unhappy that I have been crying.: No, never The thought of harming myself has occurred to me.: Never [place] Postnatal Depression Scale Total: 0  PHQ2-9 Depression Scale:     Discharge Follow-up: Edinburgh score requires follow up?: No Patient was advised of the following resources:: Support Group Requested email information regarding Baby and Me - sent by RN Post-discharge interventions: Reviewed Newborn Safe Sleep Practices  Signature Allean IVAR Carton, RN, 01/04/24,  [phone]

## 2024-01-07 ENCOUNTER — Telehealth (HOSPITAL_COMMUNITY): Payer: Self-pay | Admitting: *Deleted

## 2024-01-07 NOTE — Telephone Encounter (Signed)
 Preadmission screen

## 2024-01-12 ENCOUNTER — Inpatient Hospital Stay (HOSPITAL_COMMUNITY): Admission: AD | Admit: 2024-01-12 | Payer: 59 | Source: Home / Self Care | Admitting: Obstetrics and Gynecology

## 2024-01-12 ENCOUNTER — Inpatient Hospital Stay (HOSPITAL_COMMUNITY)

## 2024-01-12 DIAGNOSIS — F411 Generalized anxiety disorder: Secondary | ICD-10-CM | POA: Diagnosis not present

## 2024-01-12 DIAGNOSIS — J4520 Mild intermittent asthma, uncomplicated: Secondary | ICD-10-CM | POA: Diagnosis not present

## 2024-02-02 DIAGNOSIS — Z1389 Encounter for screening for other disorder: Secondary | ICD-10-CM | POA: Diagnosis not present

## 2024-02-08 DIAGNOSIS — F411 Generalized anxiety disorder: Secondary | ICD-10-CM | POA: Diagnosis not present

## 2024-02-08 DIAGNOSIS — K219 Gastro-esophageal reflux disease without esophagitis: Secondary | ICD-10-CM | POA: Diagnosis not present

## 2024-05-02 DIAGNOSIS — F411 Generalized anxiety disorder: Secondary | ICD-10-CM | POA: Diagnosis not present

## 2024-05-02 DIAGNOSIS — L2389 Allergic contact dermatitis due to other agents: Secondary | ICD-10-CM | POA: Diagnosis not present
